# Patient Record
Sex: Male | Born: 1965 | Race: White | Hispanic: No | Marital: Married | Smoking: Former smoker
Health system: Southern US, Community
[De-identification: ages and names within clinical notes are randomized; demographics above are authoritative.]

## PROBLEM LIST (undated history)

## (undated) DIAGNOSIS — K219 Gastro-esophageal reflux disease without esophagitis: Secondary | ICD-10-CM

## (undated) DIAGNOSIS — Z8547 Personal history of malignant neoplasm of testis: Secondary | ICD-10-CM

## (undated) DIAGNOSIS — IMO0002 Reserved for concepts with insufficient information to code with codable children: Secondary | ICD-10-CM

## (undated) DIAGNOSIS — C6290 Malignant neoplasm of unspecified testis, unspecified whether descended or undescended: Secondary | ICD-10-CM

## (undated) DIAGNOSIS — E039 Hypothyroidism, unspecified: Secondary | ICD-10-CM

## (undated) DIAGNOSIS — F419 Anxiety disorder, unspecified: Secondary | ICD-10-CM

## (undated) DIAGNOSIS — R002 Palpitations: Secondary | ICD-10-CM

## (undated) DIAGNOSIS — E038 Other specified hypothyroidism: Secondary | ICD-10-CM

## (undated) HISTORY — DX: Other specified hypothyroidism: E03.8

## (undated) HISTORY — DX: Reserved for concepts with insufficient information to code with codable children: IMO0002

## (undated) HISTORY — DX: Palpitations: R00.2

## (undated) HISTORY — DX: Gastro-esophageal reflux disease without esophagitis: K21.9

## (undated) HISTORY — DX: Personal history of malignant neoplasm of testis: Z85.47

## (undated) HISTORY — DX: Hypothyroidism, unspecified: E03.9

## (undated) HISTORY — DX: Malignant neoplasm of unspecified testis, unspecified whether descended or undescended: C62.90

## (undated) HISTORY — DX: Anxiety disorder, unspecified: F41.9

---

## 1986-11-27 HISTORY — PX: HAND SURGERY: SHX662

## 1991-11-28 DIAGNOSIS — Z8547 Personal history of malignant neoplasm of testis: Secondary | ICD-10-CM

## 1991-11-28 HISTORY — DX: Personal history of malignant neoplasm of testis: Z85.47

## 2008-01-10 ENCOUNTER — Ambulatory Visit (HOSPITAL_COMMUNITY)
Admission: RE | Admit: 2008-01-10 | Discharge: 2008-01-10 | Payer: Self-pay | Admitting: Physical Medicine and Rehabilitation

## 2008-01-23 ENCOUNTER — Ambulatory Visit (HOSPITAL_COMMUNITY): Admission: RE | Admit: 2008-01-23 | Discharge: 2008-01-24 | Payer: Self-pay | Admitting: Specialist

## 2008-03-06 ENCOUNTER — Emergency Department (HOSPITAL_COMMUNITY): Admission: EM | Admit: 2008-03-06 | Discharge: 2008-03-06 | Payer: Self-pay | Admitting: Emergency Medicine

## 2008-11-27 HISTORY — PX: BACK SURGERY: SHX140

## 2009-06-30 ENCOUNTER — Encounter: Admission: RE | Admit: 2009-06-30 | Discharge: 2009-06-30 | Payer: Self-pay | Admitting: Orthopaedic Surgery

## 2010-01-19 IMAGING — CR DG LUMBAR SPINE 2-3V
2 series · 2 of 2 positions shown · non-contrast
Comparison: MRI of the lumbar spine done at [facility] 01/10/08.

CLINICAL DATA: L5-S1 disc herniation.  Pre-diskectomy. 
 LUMBAR SPINE ? 2 VIEW:

[t l-spine a.p.]
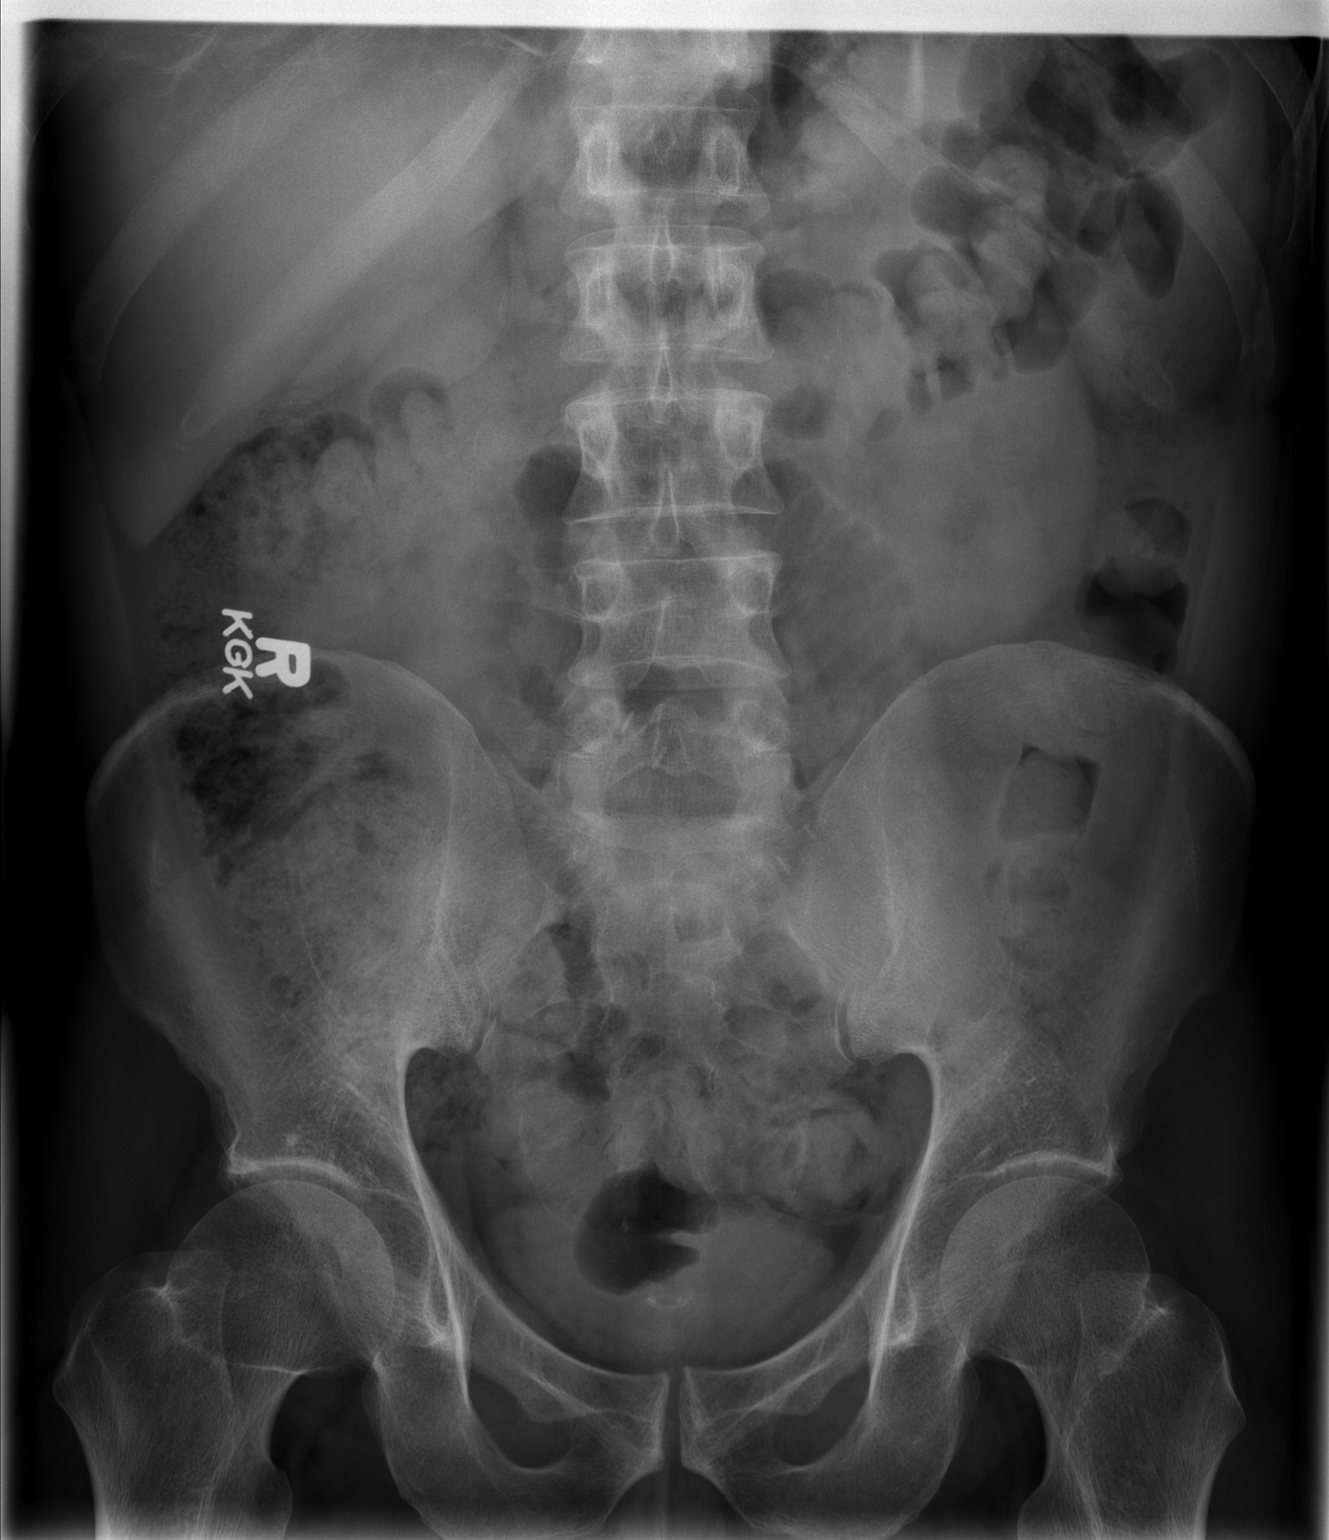

[t l-spine lat]
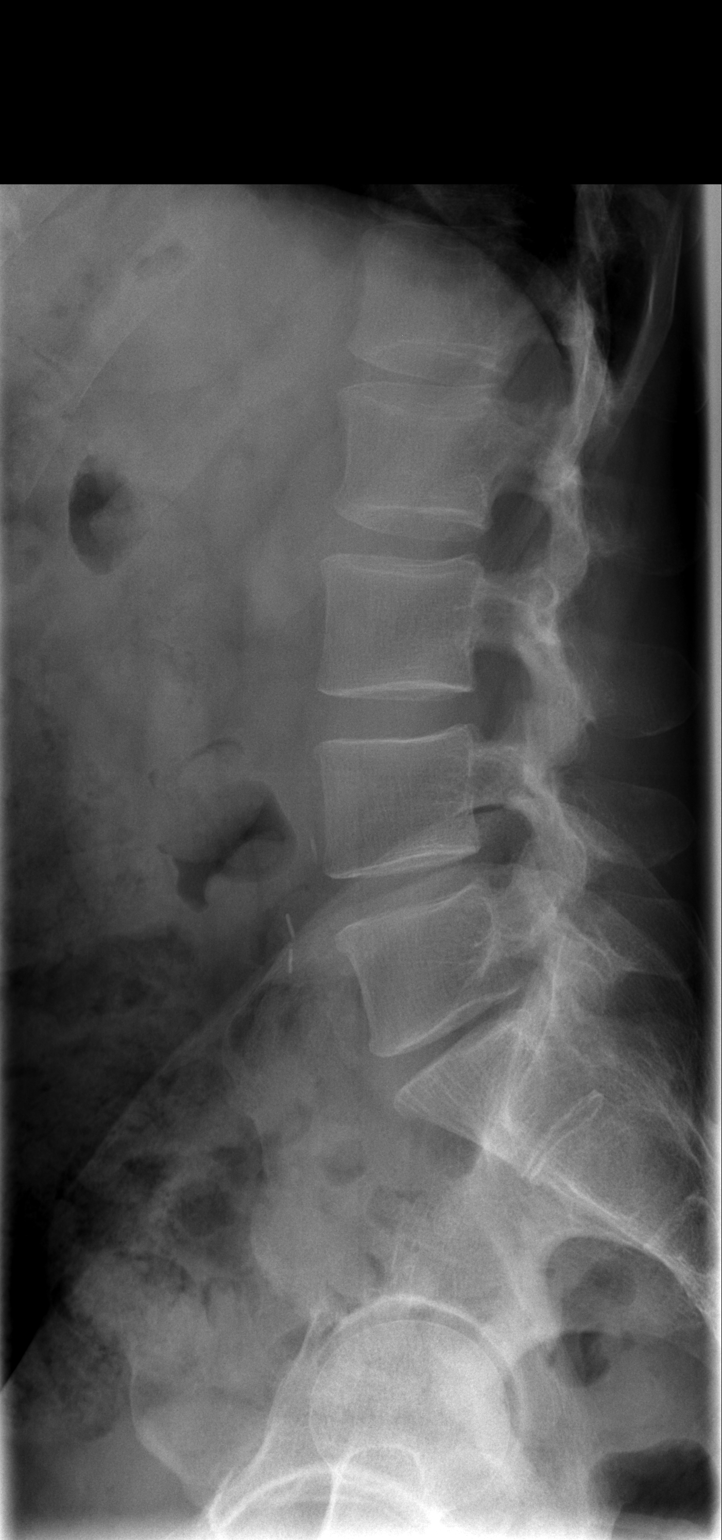

[2 of 2 positions shown; findings below may reference images not displayed]

FINDINGS: The prior MRI assumed the presence of five lumbar type vertebral bodies.  The same numbering scheme was applied to today?s radiographs.  Utilizing this numbering scheme, there may be small ribs at L1.  There is mild disc space loss at L5-S1.  Surgical clips are noted anterior to the L5 vertebral body.  No acute osseous findings are seen.
IMPRESSION: Spinal numbering as correlated with previous MRI from 01/10/08.  Mild disc space loss at L5-S1.

## 2011-04-11 NOTE — Op Note (Signed)
[name], [name] NO.:  [id number]   MEDICAL RECORD NO.:  [id number]          PATIENT TYPE:  OIB   LOCATION:  1605                         FACILITY:  [facility]   PHYSICIAN:  Jene Every, M.D.    DATE OF BIRTH:  August 10, 1966   DATE OF PROCEDURE:  01/23/2008  DATE OF DISCHARGE:  01/24/2008                               OPERATIVE REPORT   PREOPERATIVE DIAGNOSIS:  1. 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  2. Disk degeneration L5-S1 left.   POSTOPERATIVE DIAGNOSIS:  1. 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  2. Disk degeneration L5-S1 left.   OPERATION/PROCEDURE:  1. lateral recess decompression foraminotomy of S1-L5.  2. Microdiskectomy L5-S1, left.   ANESTHESIA:  General.   ASSISTANT:  Roma Schanz, P.A.   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  He was  indicated for decompression L5-S1 nerve roots by microdiskectomy.  The  patient is noted to be a smoker and was advised tobacco cessation.  Thus, risks and benefits including bleeding and infection, hemorrhage,  rash __________ change in symptoms, worsening symptoms, need for repeat  decompression, anesthetic complications, etc.   With the patient in supine position after induction of adequate  anesthesia and 2 grams of Kefzol, he was placed prone on the [place]  frame.  All bony points were well padded.  Lumbar is prepped and draped  in the usual sterile fashion.  Two 18-gauge spinal needles utilized to  localize L5-S1 interspace confirmed with x-ray.  Incision was made  through the spinous process L5-S1.  Subcutaneous tissue was dissected  free to achieve hemostasis.  Dorsolumbar fascia identified by the skin  incision.  Paraspinous muscle elevated from the lamina of L5 and S1.  Operating microscope  draped and brought to the surgical field.  Confirmatory radiographs obtained. Hemilaminotomy at the caudad edge of  L5 was performed with a 2 mm and 3 mm Kerrison. detaching ligamentum  flavum.  Ligamentum flavum was detached from cephalad edge of S1  utilizing straight curette.  Ligamentum flavum removed from the  interspace.  Using the operating microscope, we copiously irrigated the  wound.  We gently mobilized the thecal sac.  The nerve was found to be  flattened and compressed in the lateral recess.  We decompressed the  lateral recess to the medial border of the pedicle and out laterally.  I  gently mobilized.  The disk herniation was noted and found to be large.  With a nerve hook, the fragment was then removed with micro pituitary  translating laterally from beneath the nerve root.  The small fragments  were removed and this fascia, which gave [place] some mild ability to gain  access to beneath thecal sac and lateral to the S1 nerve root at the  region of the disk space. Then the nerve root then mobilized a larger  fragment which was treated with micropituitary and then the largest  fragment was  noted to be connected from beneath thecal sac out of the  foramen of L5 and had migrated cephalad to above the L5 pedicle.  We  removed one large fragment.  This consistent with this mass seen then on  MRI.  Following this there was significant mobility of the S1 nerve root  that was noted.  We first performed foraminotomy of S1.  We then formed  a foraminotomy of L5.  The thecal sac and the S1 nerve root were gently  mobilized medially.  We then performed an annulotomy.  Disk space was  significant generated.  We were unable to place a pituitary the open the  disk space.  I mobilized beneath the thecal sac and incised the  posterior longitudinal ligament as well. There was a small separate  fragment beneath that which was retrieved.  Just medial to that though,  there were adhesions of the  thecal sac to the posterior longitudinal  ligament. I felt further longitudinal dissection here would place him at  risk for a CSF  leakage.  I felt the predominance of a compression was  relieved with the removal of the fragments previously mentioned.  Disk  space copiously irrigated with antibiotic irrigation and bipolar cautery  was allowed to achieve hemostasis.  Inspection revealed no signs of CSF  leakage or active bleeding.  There was good excursion of the S1 nerve  root was medial to the pedicle.  Hockey stick probe passed freely out of  L5-S1.  Epidural fat was then placed over the thecal sac and the root.  Removed the [facility] retractor.  Paraspinous muscle inspected with no  active bleeding.  We copiously irrigated the wound, repaired the fascia  with #1 Vicryl interrupted figure-of-eight sutures, subcutaneous tissue  with 2-0 Vicryl simple sutures and the skin was reapproximated with 4-0  subcuticular Prolene.  Wound reinforced with Steri-Strips.  Sterile  dressing applied.  He was placed supine on hospital bed, extubated  without difficulty and transported to the recovery room in satisfactory  condition.   The patient tolerated the procedure well. There were no complications.  Minimal blood loss.      Jene Every, M.D.  Electronically Signed     JB/MEDQ  D:  01/23/2008  T:  01/24/2008  Job:  [id number]

## 2011-08-18 LAB — CBC
HCT: 40.3
MCHC: 34.7
MCV: 95.2
Platelets: 435 — ABNORMAL HIGH
RDW: 12.8

## 2011-08-18 LAB — COMPREHENSIVE METABOLIC PANEL
Albumin: 4.1
BUN: 10
Calcium: 9.6
Chloride: 102
Creatinine, Ser: 0.81
GFR calc Af Amer: >60
Total Bilirubin: 0.9
Total Protein: 7.2

## 2011-08-22 ENCOUNTER — Ambulatory Visit: Payer: Self-pay

## 2011-08-22 LAB — DIFFERENTIAL
Basophils Relative: 1
Eosinophils Relative: 2
Monocytes Absolute: 0.7
Monocytes Relative: 6
Neutro Abs: 9.3 — ABNORMAL HIGH

## 2011-08-22 LAB — POCT CARDIAC MARKERS
CKMB, poc: <1 — ABNORMAL LOW
Myoglobin, poc: 39.5

## 2011-08-22 LAB — CBC
HCT: 39.4
Hemoglobin: 13.4
MCHC: 34.2
RBC: 4.16 — ABNORMAL LOW

## 2011-08-22 LAB — POCT I-STAT, CHEM 8
Creatinine, Ser: 1
HCT: 42
Hemoglobin: 14.3
Potassium: 4.2
Sodium: 138

## 2014-08-14 ENCOUNTER — Other Ambulatory Visit: Payer: Self-pay | Admitting: Orthopaedic Surgery

## 2014-08-14 DIAGNOSIS — M47812 Spondylosis without myelopathy or radiculopathy, cervical region: Secondary | ICD-10-CM

## 2014-08-25 ENCOUNTER — Ambulatory Visit
Admission: RE | Admit: 2014-08-25 | Discharge: 2014-08-25 | Disposition: A | Discharge status: Home / Self Care | Payer: Medicare Other | Source: Ambulatory Visit | Attending: Orthopaedic Surgery | Admitting: Orthopaedic Surgery

## 2014-08-25 DIAGNOSIS — M47812 Spondylosis without myelopathy or radiculopathy, cervical region: Secondary | ICD-10-CM

## 2015-01-05 DIAGNOSIS — G894 Chronic pain syndrome: Secondary | ICD-10-CM | POA: Diagnosis not present

## 2015-01-05 DIAGNOSIS — Z79891 Long term (current) use of opiate analgesic: Secondary | ICD-10-CM | POA: Diagnosis not present

## 2015-01-05 DIAGNOSIS — Z79899 Other long term (current) drug therapy: Secondary | ICD-10-CM | POA: Diagnosis not present

## 2015-01-05 DIAGNOSIS — M5002 Cervical disc disorder with myelopathy, mid-cervical region: Secondary | ICD-10-CM | POA: Diagnosis not present

## 2015-03-05 DIAGNOSIS — Z6825 Body mass index (BMI) 25.0-25.9, adult: Secondary | ICD-10-CM | POA: Diagnosis not present

## 2015-03-05 DIAGNOSIS — R0789 Other chest pain: Secondary | ICD-10-CM | POA: Diagnosis not present

## 2015-03-05 DIAGNOSIS — R002 Palpitations: Secondary | ICD-10-CM | POA: Diagnosis not present

## 2015-03-08 ENCOUNTER — Encounter: Payer: Self-pay | Admitting: Cardiology

## 2015-03-08 ENCOUNTER — Ambulatory Visit (INDEPENDENT_AMBULATORY_CARE_PROVIDER_SITE_OTHER): Payer: Self-pay | Admitting: Cardiology

## 2015-03-08 VITALS — BP 144/86 | HR 67 | Ht 71.0 in | Wt 179.7 lb

## 2015-03-08 DIAGNOSIS — Z87891 Personal history of nicotine dependence: Secondary | ICD-10-CM

## 2015-03-08 DIAGNOSIS — R262 Difficulty in walking, not elsewhere classified: Secondary | ICD-10-CM | POA: Insufficient documentation

## 2015-03-08 DIAGNOSIS — R072 Precordial pain: Secondary | ICD-10-CM | POA: Insufficient documentation

## 2015-03-08 DIAGNOSIS — R030 Elevated blood-pressure reading, without diagnosis of hypertension: Secondary | ICD-10-CM

## 2015-03-08 DIAGNOSIS — R079 Chest pain, unspecified: Secondary | ICD-10-CM

## 2015-03-08 DIAGNOSIS — R42 Dizziness and giddiness: Secondary | ICD-10-CM

## 2015-03-08 NOTE — Assessment & Plan Note (Signed)
Not sure what this is associated with. He had similar symptoms when he is on narcotics in the past but has not used ultrasound a while. This may all be related to his back pain. It does not sound like he was near syncopal or vertigo.

## 2015-03-08 NOTE — Assessment & Plan Note (Signed)
Features as indicated above. As he is not able to walk on treadmill, we will proceed Lexiscan Myoview stress test for ischemia.

## 2015-03-08 NOTE — Progress Notes (Addendum)
PATIENT: [name] MRN: [id number] DOB: 1966-11-23 PCP: Leonides Sake, MD  Clinic Note: Chief Complaint  Patient presents with  . New Patient    pt c/o off and on chest tightness, discomfort in shoulder and diziness  . Chest Pain    HPI: [name] is a 49 y.o. male with a PMH below who presents today for evaluation of CP & lightheadedness. Sx over past ~ 1 month or so.  No Cardiac RFs except for former smoker.he has previously seen Dr. Recardo Evangelist several years ago and was evaluated with a Myoview stress test that did not show signs of ischemia. He then did not followup. Paper chart was not pulled and therefore study reports are not yet available.   Interval History: Had evaluation in the past for similar Sx yrs ago following back Sgx (~2011 - negative Myoview).  C/o chest heaviness that is intermittent - can last 1-2 hrs or all day. Notes lightheadedness, but no near syncope or syncope.  Can be lightheaded all day w/o other Sx.  Some days has all the Sx.  Chest discomfort is not exacerbated by exertion etc.  House work & yard work - doesn't make Sx worse.  Just feels bad all the time.    He describes the discomfort is just as a heaviness and pressure was across the top of his chest and can radiate down the left arm and shoulder. It is not exacerbated necessarily by exertion. He doesn't note any really makes it better. It can last anywhere from 30 minutes to all day. He also notes intermittent palpitations and lightheadedness during the course of the day but no near syncope or syncope.  Cardiovascular ROS: positive for - chest pain, palpitations and lightheadedness negative for - dyspnea on exertion, edema, loss of consciousness, murmur, orthopnea, paroxysmal nocturnal dyspnea, rapid heart rate, shortness of breath or TIA,/amaurosis fugx, syncope/near syncope :   Past Medical History  Diagnosis Date  . H/O testicular cancer 1993    s/p R orchiectomy w/ LN dissection, Chemo      Prior Cardiac Evaluation and Past Surgical History: Past Surgical History  Procedure Laterality Date  . Back surgery  2010    Lumber fusion  . Hand surgery  1988    No Known Allergies  Current Outpatient Prescriptions  Medication Sig Dispense Refill  . naproxen (NAPROSYN) 500 MG tablet Take 1 tablet by mouth 2 (two) times daily.  2  . traMADol (ULTRAM) 50 MG tablet Take 50 mg by mouth every 6 (six) hours as needed.  2   No current facility-administered medications for this visit.    History  Substance Use Topics  . Smoking status: Former Smoker    Quit date: 03/07/2008  . Smokeless tobacco: Not on file  . Alcohol Use: 0.0 oz/week    0 Standard drinks or equivalent per week     Comment: very seldom   currently disabled secondary to back pain/back surgery  family history includes Cancer (age of onset: 57) in his father; Cancer (age of onset: 25) in his brother; Coronary artery disease in his brother; Diabetes in his brother; Heart attack (age of onset: 46) in his brother.  ROS: A comprehensive Review of Systems - was performed Review of Systems  Constitutional: Positive for malaise/fatigue.  Respiratory: Positive for cough (Morning cough). Negative for shortness of breath and wheezing.   Cardiovascular: Positive for chest pain. Negative for claudication.       Per history of present illness  Gastrointestinal: Negative for blood in stool and melena.  Genitourinary: Negative for hematuria.  Musculoskeletal: Positive for back pain (Back pain with radiculopathy. In its activity level. He is disabled.).  Neurological: Positive for weakness. Negative for dizziness, speech change, focal weakness and loss of consciousness.  All other systems reviewed and are negative.   PHYSICAL EXAM BP 144/86 mmHg  Pulse 67  Ht 5\' 11"  (1.803 m)  Wt 179 lb 11.2 oz (81.511 kg)  BMI 25.07 kg/m2 General appearance: alert, cooperative, no distress and Somewhat anxious and concerned.  Otherwise pleasant mood and affect. Neck: no adenopathy, no carotid bruit, no JVD and supple, symmetrical, trachea midline Lungs: clear to auscultation bilaterally, normal percussion bilaterally and Nonlabored, good air movement Heart: regular rate and rhythm, S1, S2 normal, no murmur, click, rub or gallop and normal apical impulse Abdomen: soft, non-tender; bowel sounds normal; no masses,  no organomegaly Extremities: extremities normal, atraumatic, no cyanosis or edema Pulses: 2+ and symmetric Skin: Skin color, texture, turgor normal. No rashes or lesions Neurologic: Alert and oriented X 3, normal strength and tone. Normal symmetric reflexes. Normal coordination and gait   Adult ECG Report  Rate: 67 ;  Rhythm: normal sinus rhythm  QRS Axis: 50 ;  PR Interval: 114 ;  QRS Duration: 108 ; QTc: 426,;  Voltages: normal.  Narrative Interpretation: essentially normal EKG  Recent Labs: from PCP dated 03/06/2015:  Na+ 141, K+ 4.6, Cl- 100, HCO3- 24 , BUN 15, Cr 0.94, Glu 95, Ca2+ 10; AST 16, ALT 18 AlkP 65, Alb 5.1, TP 7.3, T Bili 0.5; TSH 2.2  CBC: W 7.6, H/H 15.3/45.5, Plt 350  Lipids not provided  ASSESSMENT / PLAN: 49 year old gentleman long-standing chronic back pain who now presents with prolonged left-sided chest discomfort radiating to the left arm and jaw. It is somewhat atypical infected has been long-standing and last usually hours to all day. Also atypical features include infected it is not necessarily exacerbated with exertion. His main cardiac risk factor is that he is a smoker. Unfortunately he is not able to walk on treadmill due to back pain.  Problem List Items Addressed This Visit    Borderline high blood pressure    borderline pressure today. We'll continue to monitor. Pending results of stress test we may need to treat.      Cannot walk    Inability to walk on treadmill secondary to neurologic condition. Will need to use Lexiscan Myoview.      Relevant Orders   EKG  12-Lead   Myocardial Perfusion Imaging   Chest pain with moderate risk for cardiac etiology - Primary    Features as indicated above. As he is not able to walk on treadmill, we will proceed Lexiscan Myoview stress test for ischemia.      Relevant Orders   EKG 12-Lead   Myocardial Perfusion Imaging   Former smoker (Chronic)   Lightheadedness    Not sure what this is associated with. He had similar symptoms when he is on narcotics in the past but has not used ultrasound a while. This may all be related to his back pain. It does not sound like he was near syncopal or vertigo.         Meds ordered this encounter  Medications  . naproxen (NAPROSYN) 500 MG tablet    Sig: Take 1 tablet by mouth 2 (two) times daily.    Refill:  2  . traMADol (ULTRAM) 50 MG tablet    Sig: Take 50  mg by mouth every 6 (six) hours as needed.    Refill:  2    Followup: ~1 months  Kionte Baumgardner W. Ellyn Hack, M.D., M.S. Interventional Cardiolgy [facility]

## 2015-03-08 NOTE — Patient Instructions (Signed)
Your physician has requested that you have a lexiscan myoview. For further information please visit [email or web address]. Please follow instruction sheet, as given.  Your physician recommends that you schedule a follow-up appointment in AFTER TEST AT NEXT AVAILABLE OFFICE VISIT

## 2015-03-08 NOTE — Assessment & Plan Note (Signed)
Inability to walk on treadmill secondary to neurologic condition. Will need to use Lexiscan Myoview.

## 2015-03-08 NOTE — Assessment & Plan Note (Signed)
borderline pressure today. We'll continue to monitor. Pending results of stress test we may need to treat.

## 2015-03-18 ENCOUNTER — Telehealth (HOSPITAL_COMMUNITY): Payer: Self-pay

## 2015-03-18 NOTE — Telephone Encounter (Signed)
Encounter complete. 

## 2015-03-23 ENCOUNTER — Ambulatory Visit (HOSPITAL_COMMUNITY)
Admission: RE | Admit: 2015-03-23 | Discharge: 2015-03-23 | Disposition: A | Discharge status: Home / Self Care | Payer: Medicare Other | Source: Ambulatory Visit | Attending: Cardiology | Admitting: Cardiology

## 2015-03-23 DIAGNOSIS — Z87891 Personal history of nicotine dependence: Secondary | ICD-10-CM | POA: Diagnosis not present

## 2015-03-23 DIAGNOSIS — Z8249 Family history of ischemic heart disease and other diseases of the circulatory system: Secondary | ICD-10-CM | POA: Diagnosis not present

## 2015-03-23 DIAGNOSIS — R42 Dizziness and giddiness: Secondary | ICD-10-CM | POA: Insufficient documentation

## 2015-03-23 DIAGNOSIS — I10 Essential (primary) hypertension: Secondary | ICD-10-CM | POA: Diagnosis not present

## 2015-03-23 DIAGNOSIS — R079 Chest pain, unspecified: Secondary | ICD-10-CM | POA: Insufficient documentation

## 2015-03-23 DIAGNOSIS — R0609 Other forms of dyspnea: Secondary | ICD-10-CM | POA: Insufficient documentation

## 2015-03-23 DIAGNOSIS — R0602 Shortness of breath: Secondary | ICD-10-CM | POA: Diagnosis not present

## 2015-03-23 DIAGNOSIS — R002 Palpitations: Secondary | ICD-10-CM | POA: Insufficient documentation

## 2015-03-23 DIAGNOSIS — R262 Difficulty in walking, not elsewhere classified: Secondary | ICD-10-CM

## 2015-03-23 HISTORY — PX: NM MYOVIEW LTD: HXRAD82

## 2015-03-23 MED ORDER — TECHNETIUM TC 99M SESTAMIBI GENERIC - CARDIOLITE
10.6000 | Freq: Once | INTRAVENOUS | Status: AC | PRN
  Administered 2015-03-23: 11 via INTRAVENOUS

## 2015-03-23 MED ORDER — REGADENOSON 0.4 MG/5ML IV SOLN
0.4000 mg | Freq: Once | INTRAVENOUS | Status: AC
  Administered 2015-03-23: 0.4 mg via INTRAVENOUS

## 2015-03-23 MED ORDER — TECHNETIUM TC 99M SESTAMIBI GENERIC - CARDIOLITE
30.2000 | Freq: Once | INTRAVENOUS | Status: AC | PRN
  Administered 2015-03-23: 30.2 via INTRAVENOUS

## 2015-03-23 MED ORDER — AMINOPHYLLINE 25 MG/ML IV SOLN
75.0000 mg | Freq: Once | INTRAVENOUS | Status: AC
  Administered 2015-03-23: 75 mg via INTRAVENOUS

## 2015-03-23 NOTE — Progress Notes (Signed)
Quick Note:  Nuclear Stress Test Result  ECG is normal; &Images look good. No sign of large Vessel ischemia. Normal function.  Leonie Man, MD  Pls fwd to PCP - Dr. Daiva Eves ______

## 2015-03-23 NOTE — Procedures (Addendum)
[facility] [address] [place] [address] [phone]  Cardiology Nuclear Med Study  [name] is a 49 y.o. male     MRN : [id number]     DOB: 1966/10/06  Procedure Date: 03/23/2015  Nuclear Med Background Indication for Stress Test:  Evaluation for Ischemia History:  No prior cardiac or respiratory history reported. Last NUC MPI in 2011-not in EPIC for review. Cardiac Risk Factors: Family History - CAD, History of Smoking and Hypertension  Symptoms:  Chest Pain, Dizziness, DOE, Fatigue, Light-Headedness, Palpitations and SOB   Nuclear Pre-Procedure Caffeine/Decaff Intake:  9:30pm NPO After: 5:30am   IV Site: R Forearm  IV 0.9% NS with Angio Cath:  22g  Chest Size (in):  40" IV Started by: Larene Beach, RN  Height: 5\' 11"  (1.803 m)  Cup Size: n/a  BMI:  Body mass index is 24.98 kg/(m^2). Weight:  179 lb (81.194 kg)   Tech Comments:  n/a    Nuclear Med Study 1 or 2 day study: 1 day  Stress Test Type:  [place] Provider:  Mathews Robinsons   Resting Radionuclide: Technetium 79m Sestamibi  Resting Radionuclide Dose: 10.6 mCi   Stress Radionuclide:  Technetium 64m Sestamibi  Stress Radionuclide Dose: 30.2 mCi           Stress Protocol Rest HR: 60 Stress HR: 118  Rest BP: 118/80 Stress BP: 130/68  Exercise Time (min): n/a METS: n/a          Dose of Adenosine (mg):  n/a Dose of Lexiscan: 0.4 mg  Dose of Atropine (mg): n/a Dose of Dobutamine: n/a mcg/kg/min (at max HR)  Stress Test Technologist: Mellody Memos, CCT Nuclear Technologist:Elizabeth Young,CNMT   Rest Procedure:  Myocardial perfusion imaging was performed at rest 45 minutes following the intravenous administration of Technetium 74m Sestamibi. Stress Procedure:  The patient received IV Lexiscan 0.4 mg over 15-seconds.  Technetium 3m Sestamibi injected IV at 30-seconds.  Patient experienced shortness of breath, stomach  pains, head pains and was administered 75 mg of Aminophylline IV. There were no significant changes with Lexiscan.  Quantitative spect images were obtained after a 45 minute delay.   Rest ECG: NSR - Normal EKG  Stress ECG: No significant change from baseline ECG  QPS Raw Data Images:  Normal; no motion artifact; normal heart/lung ratio. Stress Images:  Normal homogeneous uptake in all areas of the myocardium. Rest Images:  Normal homogeneous uptake in all areas of the myocardium. Subtraction (SDS):  No evidence of ischemia. LV Wall Motion:  NL LV Function; NL Wall Motion  Impression Exercise Capacity:  Lexiscan with no exercise. BP Response:  Normal blood pressure response. Clinical Symptoms:  No significant symptoms noted. ECG Impression:  No significant ST segment change suggestive of ischemia. Comparison with Prior Nuclear Study: No previous nuclear study performed   Overall Impression:  Normal stress nuclear study.   [name], MD  03/23/2015 1:15 PM      Transient Ischemic Dilatation (Normal <1.22):  1.21  QGS EDV:  98 ml QGS ESV:  46 ml LV Ejection Fraction: 54%

## 2015-03-24 ENCOUNTER — Telehealth: Payer: Self-pay | Admitting: *Deleted

## 2015-03-24 NOTE — Telephone Encounter (Signed)
-----   Message from Leonie Man, MD sent at 03/23/2015  4:37 PM EDT ----- Nuclear Stress Test Result  ECG is normal; &Images look good.  No sign of large Vessel ischemia. Normal function.  Leonie Man, MD  Pls fwd to PCP - Dr. Daiva Eves

## 2015-03-24 NOTE — Telephone Encounter (Signed)
Spoke to patient. Result given . Verbalized understanding  

## 2015-03-29 DIAGNOSIS — M5002 Cervical disc disorder with myelopathy, mid-cervical region: Secondary | ICD-10-CM | POA: Diagnosis not present

## 2015-03-29 DIAGNOSIS — G894 Chronic pain syndrome: Secondary | ICD-10-CM | POA: Diagnosis not present

## 2015-03-30 DIAGNOSIS — F419 Anxiety disorder, unspecified: Secondary | ICD-10-CM | POA: Diagnosis not present

## 2015-03-30 DIAGNOSIS — L989 Disorder of the skin and subcutaneous tissue, unspecified: Secondary | ICD-10-CM | POA: Diagnosis not present

## 2015-03-30 DIAGNOSIS — Z6824 Body mass index (BMI) 24.0-24.9, adult: Secondary | ICD-10-CM | POA: Diagnosis not present

## 2015-03-30 DIAGNOSIS — G4700 Insomnia, unspecified: Secondary | ICD-10-CM | POA: Diagnosis not present

## 2015-03-31 ENCOUNTER — Other Ambulatory Visit: Payer: Self-pay | Admitting: Orthopaedic Surgery

## 2015-03-31 DIAGNOSIS — M5030 Other cervical disc degeneration, unspecified cervical region: Secondary | ICD-10-CM

## 2015-04-10 ENCOUNTER — Ambulatory Visit
Admission: RE | Admit: 2015-04-10 | Discharge: 2015-04-10 | Disposition: A | Discharge status: Home / Self Care | Payer: Medicare Other | Source: Ambulatory Visit | Attending: Orthopaedic Surgery | Admitting: Orthopaedic Surgery

## 2015-04-10 DIAGNOSIS — M5022 Other cervical disc displacement, mid-cervical region: Secondary | ICD-10-CM | POA: Diagnosis not present

## 2015-04-10 DIAGNOSIS — M5030 Other cervical disc degeneration, unspecified cervical region: Secondary | ICD-10-CM

## 2015-04-12 DIAGNOSIS — G894 Chronic pain syndrome: Secondary | ICD-10-CM | POA: Diagnosis not present

## 2015-04-12 DIAGNOSIS — M5002 Cervical disc disorder with myelopathy, mid-cervical region: Secondary | ICD-10-CM | POA: Diagnosis not present

## 2015-04-21 DIAGNOSIS — M47812 Spondylosis without myelopathy or radiculopathy, cervical region: Secondary | ICD-10-CM | POA: Diagnosis not present

## 2015-04-21 DIAGNOSIS — M542 Cervicalgia: Secondary | ICD-10-CM | POA: Diagnosis not present

## 2015-04-21 DIAGNOSIS — M5002 Cervical disc disorder with myelopathy, mid-cervical region: Secondary | ICD-10-CM | POA: Diagnosis not present

## 2015-05-03 DIAGNOSIS — R42 Dizziness and giddiness: Secondary | ICD-10-CM | POA: Diagnosis not present

## 2015-05-03 DIAGNOSIS — F99 Mental disorder, not otherwise specified: Secondary | ICD-10-CM | POA: Diagnosis not present

## 2015-05-03 DIAGNOSIS — R51 Headache: Secondary | ICD-10-CM | POA: Diagnosis not present

## 2015-05-03 DIAGNOSIS — M79602 Pain in left arm: Secondary | ICD-10-CM | POA: Diagnosis not present

## 2015-05-03 DIAGNOSIS — Z1389 Encounter for screening for other disorder: Secondary | ICD-10-CM | POA: Diagnosis not present

## 2015-05-10 ENCOUNTER — Other Ambulatory Visit: Payer: Self-pay | Admitting: Family Medicine

## 2015-05-10 DIAGNOSIS — G4489 Other headache syndrome: Secondary | ICD-10-CM

## 2015-05-10 DIAGNOSIS — R42 Dizziness and giddiness: Secondary | ICD-10-CM

## 2015-05-11 ENCOUNTER — Ambulatory Visit
Admission: RE | Admit: 2015-05-11 | Discharge: 2015-05-11 | Disposition: A | Discharge status: Home / Self Care | Payer: Medicare Other | Source: Ambulatory Visit | Attending: Family Medicine | Admitting: Family Medicine

## 2015-05-11 DIAGNOSIS — R42 Dizziness and giddiness: Secondary | ICD-10-CM | POA: Diagnosis not present

## 2015-05-11 DIAGNOSIS — G4489 Other headache syndrome: Secondary | ICD-10-CM

## 2015-05-11 DIAGNOSIS — H538 Other visual disturbances: Secondary | ICD-10-CM | POA: Diagnosis not present

## 2015-05-11 DIAGNOSIS — R51 Headache: Secondary | ICD-10-CM | POA: Diagnosis not present

## 2015-05-13 ENCOUNTER — Ambulatory Visit (INDEPENDENT_AMBULATORY_CARE_PROVIDER_SITE_OTHER): Payer: Medicare Other | Admitting: Cardiology

## 2015-05-13 ENCOUNTER — Encounter: Payer: Self-pay | Admitting: Cardiology

## 2015-05-13 VITALS — BP 122/84 | HR 74 | Ht 71.0 in | Wt 173.7 lb

## 2015-05-13 DIAGNOSIS — R002 Palpitations: Secondary | ICD-10-CM

## 2015-05-13 DIAGNOSIS — R030 Elevated blood-pressure reading, without diagnosis of hypertension: Secondary | ICD-10-CM

## 2015-05-13 DIAGNOSIS — R079 Chest pain, unspecified: Secondary | ICD-10-CM | POA: Diagnosis not present

## 2015-05-13 NOTE — Progress Notes (Signed)
PATIENT: CADON RACZKA MRN: [id number] DOB: 06/29/1966 PCP: Leonides Sake, MD  Clinic Note: Chief Complaint  Patient presents with  . Follow-up     chest pain-palpitations, pain and numbness in left arm and chest tightness, no shortness of breath, no edema, no pain in legs, no cramping in legs, no lightheadedness, no dizziness  . Chest Pain    HPI: Harlan Ervine Rodas is a 49 y.o. male with a PMH below who presents today for evaluation of CP & lightheadedness. Sx over past ~ 1 month or so.  No Cardiac RFs except for former smoker.he has previously seen Dr. Recardo Evangelist several years ago and was evaluated with a Myoview stress test that did not show signs of ischemia. He then did not followup. Paper chart was not pulled and therefore study reports are not yet available. Seen in April 2016:  He describesd the discomfort is just as a heaviness and pressure was across the top of his chest and can radiate down the left arm and shoulder. It is not exacerbated necessarily by exertion. He doesn't note any really makes it better. It can last anywhere from 30 minutes to all day. He also notes intermittent palpitations and lightheadedness during the course of the day but no near syncope or syncope. Negative Nuclear Stress Test 02/2015 Neck pain still bad -- ? Herniated disc on MRI    Interval History:   PCP started Lexapro for anxiety - palpitations.  Palpitations are better - but notes it when HA & neck pain occurs. Dizziness with HA - but no syncope/near syncope, but arms get numb & heavy, poor balance.  Now on Neurontin & plan for PT.   Most of his symptoms he notes is related to his left upper chest and left arm. Occasionally goes into the neck. House work & yard work - doesn't make Sx worse.  Just feels bad all the time.    Cardiovascular ROS: positive for - chest pain, palpitations and lightheadedness negative for - dyspnea on exertion, edema, loss of consciousness, murmur, orthopnea, paroxysmal  nocturnal dyspnea, rapid heart rate, shortness of breath or TIA,/amaurosis fugax, syncope/near syncope :   Past Medical History  Diagnosis Date  . H/O testicular cancer 1993    s/p R orchiectomy w/ LN dissection, Chemo  . Anxiety disorder     Prior Cardiac Evaluation and Past Surgical History: Past Surgical History  Procedure Laterality Date  . Back surgery  2010    Lumber fusion  . Hand surgery  1988  . Nm myoview ltd  03/23/2015    NORMAL Lexiscan Myoview: No ST segment changes. No evidence of ischemia or infarction. Normal function.    No Known Allergies  Current Outpatient Prescriptions  Medication Sig Dispense Refill  . escitalopram (LEXAPRO) 10 MG tablet Take 1 tablet by mouth daily. Take 1 tab daily    . gabapentin (NEURONTIN) 300 MG capsule Take 1 capsule by mouth daily. Take 1-2 tabs per day as needed for pain  1  . zolpidem (AMBIEN) 10 MG tablet Take 1 tablet by mouth at bedtime. Take 0.5 to 1 tab at bedtime as needed  2   No current facility-administered medications for this visit.    History  Substance Use Topics  . Smoking status: Former Smoker    Quit date: 03/07/2008  . Smokeless tobacco: Not on file  . Alcohol Use: 0.0 oz/week    0 Standard drinks or equivalent per week     Comment: very seldom  currently disabled secondary to back pain/back surgery  family history includes Cancer (age of onset: 48) in his father; Cancer (age of onset: 71) in his brother; Coronary artery disease in his brother; Diabetes in his brother; Heart attack (age of onset: 61) in his brother.  ROS: A comprehensive Review of Systems - was performed Review of Systems  Constitutional: Positive for malaise/fatigue.  Respiratory: Positive for cough (Morning cough). Negative for shortness of breath and wheezing.   Cardiovascular: Positive for chest pain. Negative for claudication.       Per history of present illness  Gastrointestinal: Negative for blood in stool and melena.    Genitourinary: Negative for hematuria.  Musculoskeletal: Positive for back pain (Back pain with radiculopathy. In its activity level. He is disabled.).  Neurological: Positive for weakness. Negative for dizziness, speech change, focal weakness and loss of consciousness.  All other systems reviewed and are negative.   PHYSICAL EXAM BP 122/84 mmHg  Pulse 74  Ht 5\' 11"  (1.803 m)  Wt 78.79 kg (173 lb 11.2 oz)  BMI 24.24 kg/m2 General appearance: alert, cooperative, no distress and Somewhat anxious and concerned. Otherwise pleasant mood and affect. Neck: no adenopathy, no carotid bruit, no JVD and supple, symmetrical, trachea midline Lungs: clear to auscultation bilaterally, normal percussion bilaterally and Nonlabored, good air movement Heart: regular rate and rhythm, S1, S2 normal, no murmur, click, rub or gallop and normal apical impulse Abdomen: soft, non-tender; bowel sounds normal; no masses,  no organomegaly Extremities: extremities normal, atraumatic, no cyanosis or edema Pulses: 2+ and symmetric Skin: Skin color, texture, turgor normal. No rashes or lesions Neurologic: Alert and oriented X 3, normal strength and tone. Normal symmetric reflexes. Normal coordination and gait   Adult ECG Report - N/A  Recent Labs: from PCP dated 03/06/2015:  Na+ 141, K+ 4.6, Cl- 100, HCO3- 24 , BUN 15, Cr 0.94, Glu 95, Ca2+ 10; AST 16, ALT 18 AlkP 65, Alb 5.1, TP 7.3, T Bili 0.5; TSH 2.2  CBC: W 7.6, H/H 15.3/45.5, Plt 350  Lipids not provided  ASSESSMENT / PLAN: 49 year old gentleman long-standing chronic back pain who now presents with prolonged left-sided chest discomfort radiating to the left arm and jaw. Mostly atypical atypical infected has been long-standing and last usually hours to all day. Now here for follow-up after negative Myoview stress test.  Problem List Items Addressed This Visit    Borderline high blood pressure    Better pressure today. Mostly related to stress probably.       Chest pain with low risk for cardiac etiology - Primary    As noted, most the atypical symptoms. Negative Myoview which is his second negative Myoview in couple years. Very unlikely cardiac in nature as it is not exacerbated by exertion.  Most likely related to his musculoskeletal issues as well as possible anxiety.      Intermittent palpitations (Chronic)    He does have intermittent palpitations are not prolonged. Most likely related to PVCs or PACs. Has not had a prolonged episode which remains is most likely not a rhythm. I will prefer not to try to treat him with a beta blocker, however if these do become more consistently bothersome, I would consider dihydropyridine calcium channel blocker such as diltiazem or verapamil. These have calmed down notably since starting Lexapro.         Meds ordered this encounter  Medications  . escitalopram (LEXAPRO) 10 MG tablet    Sig: Take 1 tablet by mouth daily. Take  1 tab daily  . zolpidem (AMBIEN) 10 MG tablet    Sig: Take 1 tablet by mouth at bedtime. Take 0.5 to 1 tab at bedtime as needed    Refill:  2  . gabapentin (NEURONTIN) 300 MG capsule    Sig: Take 1 capsule by mouth daily. Take 1-2 tabs per day as needed for pain    Refill:  1    Followup: PRN  Cayde Held W. Ellyn Hack, M.D., M.S. [facility]

## 2015-05-13 NOTE — Patient Instructions (Addendum)
Your physician recommends that you schedule a follow-up appointment on an as needed basis.

## 2015-05-15 ENCOUNTER — Encounter: Payer: Self-pay | Admitting: Cardiology

## 2015-05-15 DIAGNOSIS — R002 Palpitations: Secondary | ICD-10-CM | POA: Insufficient documentation

## 2015-05-15 NOTE — Assessment & Plan Note (Signed)
As noted, most the atypical symptoms. Negative Myoview which is his second negative Myoview in couple years. Very unlikely cardiac in nature as it is not exacerbated by exertion.  Most likely related to his musculoskeletal issues as well as possible anxiety.

## 2015-05-15 NOTE — Assessment & Plan Note (Signed)
He does have intermittent palpitations are not prolonged. Most likely related to PVCs or PACs. Has not had a prolonged episode which remains is most likely not a rhythm. I will prefer not to try to treat him with a beta blocker, however if these do become more consistently bothersome, I would consider dihydropyridine calcium channel blocker such as diltiazem or verapamil. These have calmed down notably since starting Lexapro.

## 2015-05-15 NOTE — Assessment & Plan Note (Signed)
Better pressure today. Mostly related to stress probably.

## 2015-05-20 ENCOUNTER — Ambulatory Visit (INDEPENDENT_AMBULATORY_CARE_PROVIDER_SITE_OTHER): Payer: Medicare Other | Admitting: Neurology

## 2015-05-20 ENCOUNTER — Other Ambulatory Visit: Payer: Self-pay | Admitting: Neurology

## 2015-05-20 ENCOUNTER — Encounter: Payer: Self-pay | Admitting: Neurology

## 2015-05-20 VITALS — BP 130/78 | HR 82 | Resp 20 | Ht 71.26 in | Wt 175.0 lb

## 2015-05-20 DIAGNOSIS — G3184 Mild cognitive impairment, so stated: Secondary | ICD-10-CM | POA: Insufficient documentation

## 2015-05-20 DIAGNOSIS — R51 Headache: Secondary | ICD-10-CM

## 2015-05-20 DIAGNOSIS — M5010 Cervical disc disorder with radiculopathy, unspecified cervical region: Secondary | ICD-10-CM

## 2015-05-20 DIAGNOSIS — R519 Headache, unspecified: Secondary | ICD-10-CM | POA: Insufficient documentation

## 2015-05-20 MED ORDER — AMITRIPTYLINE HCL 25 MG PO TABS: 25.0000 mg | ORAL_TABLET | Freq: Every day | ORAL | Status: DC

## 2015-05-20 NOTE — Patient Instructions (Signed)
Amitriptyline tablets What is this medicine? AMITRIPTYLINE (a mee TRIP ti leen) is used to treat depression. This medicine may be used for other purposes; ask your health care provider or pharmacist if you have questions. COMMON BRAND NAME(S): Elavil, Vanatrip What should I tell my health care provider before I take this medicine? They need to know if you have any of these conditions: -an alcohol problem -asthma, difficulty breathing -bipolar disorder or schizophrenia -difficulty passing urine, prostate trouble -glaucoma -heart disease or previous heart attack -liver disease -over active thyroid -seizures -thoughts or plans of suicide, a previous suicide attempt, or family history of suicide attempt -an unusual or allergic reaction to amitriptyline, other medicines, foods, dyes, or preservatives -pregnant or trying to get pregnant -breast-feeding How should I use this medicine? Take this medicine by mouth with a drink of water. Follow the directions on the prescription label. You can take the tablets with or without food. Take your medicine at regular intervals. Do not take it more often than directed. Do not stop taking this medicine suddenly except upon the advice of your doctor. Stopping this medicine too quickly may cause serious side effects or your condition may worsen. A special MedGuide will be given to you by the pharmacist with each prescription and refill. Be sure to read this information carefully each time. Talk to your pediatrician regarding the use of this medicine in children. Special care may be needed. Overdosage: If you think you have taken too much of this medicine contact a poison control center or emergency room at once. NOTE: This medicine is only for you. Do not share this medicine with others. What if I miss a dose? If you miss a dose, take it as soon as you can. If it is almost time for your next dose, take only that dose. Do not take double or extra doses. What  may interact with this medicine? Do not take this medicine with any of the following medications: -arsenic trioxide -certain medicines used to regulate abnormal heartbeat or to treat other heart conditions -cisapride -droperidol -halofantrine -linezolid -MAOIs like Carbex, Eldepryl, Marplan, Nardil, and Parnate -methylene blue -other medicines for mental depression -phenothiazines like perphenazine, thioridazine and chlorpromazine -pimozide -probucol -procarbazine -sparfloxacin -St. John's Wort -ziprasidone This medicine may also interact with the following medications: -atropine and related drugs like hyoscyamine, scopolamine, tolterodine and others -barbiturate medicines for inducing sleep or treating seizures, like phenobarbital -cimetidine -disulfiram -ethchlorvynol -thyroid hormones such as levothyroxine This list may not describe all possible interactions. Give your health care provider a list of all the medicines, herbs, non-prescription drugs, or dietary supplements you use. Also tell them if you smoke, drink alcohol, or use illegal drugs. Some items may interact with your medicine. What should I watch for while using this medicine? Tell your doctor if your symptoms do not get better or if they get worse. Visit your doctor or health care professional for regular checks on your progress. Because it may take several weeks to see the full effects of this medicine, it is important to continue your treatment as prescribed by your doctor. Patients and their families should watch out for new or worsening thoughts of suicide or depression. Also watch out for sudden changes in feelings such as feeling anxious, agitated, panicky, irritable, hostile, aggressive, impulsive, severely restless, overly excited and hyperactive, or not being able to sleep. If this happens, especially at the beginning of treatment or after a change in dose, call your health care professional. You may get   drowsy or  dizzy. Do not drive, use machinery, or do anything that needs mental alertness until you know how this medicine affects you. Do not stand or sit up quickly, especially if you are an older patient. This reduces the risk of dizzy or fainting spells. Alcohol may interfere with the effect of this medicine. Avoid alcoholic drinks. Do not treat yourself for coughs, colds, or allergies without asking your doctor or health care professional for advice. Some ingredients can increase possible side effects. Your mouth may get dry. Chewing sugarless gum or sucking hard candy, and drinking plenty of water will help. Contact your doctor if the problem does not go away or is severe. This medicine may cause dry eyes and blurred vision. If you wear contact lenses you may feel some discomfort. Lubricating drops may help. See your eye doctor if the problem does not go away or is severe. This medicine can cause constipation. Try to have a bowel movement at least every 2 to 3 days. If you do not have a bowel movement for 3 days, call your doctor or health care professional. This medicine can make you more sensitive to the sun. Keep out of the sun. If you cannot avoid being in the sun, wear protective clothing and use sunscreen. Do not use sun lamps or tanning beds/booths. What side effects may I notice from receiving this medicine? Side effects that you should report to your doctor or health care professional as soon as possible: -allergic reactions like skin rash, itching or hives, swelling of the face, lips, or tongue -abnormal production of milk in females -breast enlargement in both males and females -breathing problems -confusion, hallucinations -fast, irregular heartbeat -fever with increased sweating -muscle stiffness, or spasms -pain or difficulty passing urine, loss of bladder control -seizures -suicidal thoughts or other mood changes -swelling of the testicles -tingling, pain, or numbness in the feet or  hands -yellowing of the eyes or skin Side effects that usually do not require medical attention (report to your doctor or health care professional if they continue or are bothersome): -change in sex drive or performance -constipation or diarrhea -nausea, vomiting -weight gain or loss This list may not describe all possible side effects. Call your doctor for medical advice about side effects. You may report side effects to FDA at 1-800-FDA-1088. Where should I keep my medicine? Keep out of the reach of children. Store at room temperature between 20 and 25 degrees C (68 and 77 degrees F). Throw away any unused medicine after the expiration date. NOTE: This sheet is a summary. It may not cover all possible information. If you have questions about this medicine, talk to your doctor, pharmacist, or health care provider.  2015, [employer]. (2012-04-01 13:50:32)  

## 2015-05-20 NOTE — Addendum Note (Signed)
Addended by: Larey Seat on: 05/20/2015 09:41 AM   Modules accepted: Orders

## 2015-05-20 NOTE — Progress Notes (Signed)
Provider:  Larey Seat, M D  Referring Provider: Leonides Sake, MD Primary Care Physician:  Leonides Sake, MD  Chief Complaint  Patient presents with  . Headache    rm 10, new patient, alone  . Dizziness    HPI:  [name] is a 49 y.o. male, seen here as a referral from Dr. Lisbeth Ply for "dizziness" ,  05/20/2015 To be evaluated for headaches associated with dizziness.. The patient is an established pain clinic patient and also followed by  surgeon, Dr. Rennis Harding. He has been disabled since his lumbar fusion 2010, at the age of 89.  He underwent a lumbar disc fusion L4-5/ 6 in 2010. Over the last 10 months about September 2015  he has noted increasing neck problems and stiffness. He was diagnosed with a bulging disc in the cervical spine C5-C6.  This protrudes medial and to the right side of the spinal cord. He underwent an MRI on 04-11-15 which is a repeat study  Documenting a disc protrusion  between the fifth and sixth cervical vertebrae a central and right-sided protrusion with uncinate spurring compressing  the right C6 nerve root -impingement is still seen. Dr.: Referred him to pain specialist who in turn started gabapentin and physical therapy. The hope was to avoid surgery. Because of the complaints of headache and dizziness an MRI of the head would have been in order but a CT without contrast was ordered instead which shows no intracranial abnormalities and a normal brain on 05-11-15 it is Dr. Lisbeth Ply now requested this evaluation for headache and dizziness. The patient will remain treated for neck pain back pain and any kind of degenerative spinal joint disorder with his orthopedist and pain specialist Dr. Milas Gain low.  The patient added that he has been suffering from anxiety and insomnia and he has been treated with Lexapro and zolpidem ( insomnia, non organic ) . He denied suicidal ideation. He developed cardiac palpitation and a cardiology workup was normal including  a stress test.  The patient also reports confusion or disorientation problems memory perhaps inattentiveness at times this has preceded his medication regimen and that actually preceded his neck pain complaints. He score today on a Montral cognitive assessment test on 05-20-15, scoring 24 out of 30 points , which is less than expected for his peer group.  His memory concerns are chief concerns,he has also chronic headaches. His headaches begin in the retro-orbital region bilaterally a feeling of pressure behind the eyes, especially the medial part close to the nasal bridge, radiating from there to the occipital region and into the paraspinal area left shoulder and left arm.  Vision gets blurry with these headaches, he reports photosensitivity with his headaches, he has been eating less but he denies any nausea associated with the headaches. He seems to have no visual aura or fortification lines or flickering lights. He does not report any sensitivity to sounds. He states that all his headaches seem to radiates towards the spine and to the left side of the upper body this is curious in light of his MRI report from stating that all his C6 compression would affect the right.   He is still in his physical therapy cycle, and then this is completed he will be followed up by Dr. Maia Petties.  Dr. Lisbeth Ply requested Dr. Ellene Route for a second opinion for the left upper extremity pain and to review the cervical MRI and consider nerve conduction studies.    Review of  Systems: Out of a complete 14 system review, the patient complains of only the following symptoms, and all other reviewed systems are negative. Depression, anxiety, forgetfullnes, chronic back pain- orthopedic and neurosurgical follow up- workers comp. Marland Kitchen   History   Social History  . Marital Status: Married    Spouse Name: [name]  . Number of Children: 1  . Years of Education: N/A   Occupational History  . Not on file.   Social History Main  Topics  . Smoking status: Former Smoker    Quit date: 03/07/2008  . Smokeless tobacco: Not on file  . Alcohol Use: 0.0 oz/week    0 Standard drinks or equivalent per week     Comment: very seldom  . Drug Use: No  . Sexual Activity: Not on file   Other Topics Concern  . Not on file   Social History Narrative   1 GC - 3 wk old.   On Disabliity since Back operation.    Family History  Problem Relation Age of Onset  . Cancer Father 44  . Cancer Brother 71  . Diabetes Brother   . Coronary artery disease Brother     PCI  . Heart attack Brother 89  . Seizures Brother     Past Medical History  Diagnosis Date  . H/O testicular cancer 1993    s/p R orchiectomy w/ LN dissection, Chemo  . Anxiety disorder   . HNP (herniated nucleus pulposus)   . GERD (gastroesophageal reflux disease)   . Palpitations   . Subclinical hypothyroidism   . Testicular malignancy     Past Surgical History  Procedure Laterality Date  . Back surgery  2010    Lumber fusion  . Hand surgery  1988  . Nm myoview ltd  03/23/2015    NORMAL Lexiscan Myoview: No ST segment changes. No evidence of ischemia or infarction. Normal function.    Current Outpatient Prescriptions  Medication Sig Dispense Refill  . escitalopram (LEXAPRO) 10 MG tablet Take 1 tablet by mouth daily. Take 1 tab daily    . gabapentin (NEURONTIN) 300 MG capsule Take 1 capsule by mouth daily. Take 1-2 tabs per day as needed for pain  1  . traMADol (ULTRAM) 50 MG tablet Take 50 mg by mouth every 6 (six) hours as needed.    . zolpidem (AMBIEN) 10 MG tablet Take 1 tablet by mouth at bedtime. Take 0.5 to 1 tab at bedtime as needed  2   No current facility-administered medications for this visit.    Allergies as of 05/20/2015  . (No Known Allergies)    Vitals: BP 130/78 mmHg  Pulse 82  Resp 20  Ht 5' 11.26" (1.81 m)  Wt 175 lb (79.379 kg)  BMI 24.23 kg/m2 Last Weight:  Wt Readings from Last 1 Encounters:  05/20/15 175 lb  (79.379 kg)   Last Height:   Ht Readings from Last 1 Encounters:  05/20/15 5' 11.26" (1.81 m)    Physical exam:  General: The patient is awake, alert and appears not in acute distress. The patient is well groomed. Head: Normocephalic, atraumatic. Neck is supple. Mallampati 3, neck circumference:16 inches.  The patient wears dentures the upper satisfactory fitting well the bottom plate is not in today, he does have TMJ and a click with opening and closing of the jaw that seems to be right side dominant. There is a click present on the left side as well. There is no delayed swallowing. Cardiovascular:  Regular rate  and rhythm, without  murmurs or carotid bruit, and without distended neck veins. Respiratory: Lungs are clear to auscultation. Skin:  Without evidence of edema, or rash- he is diaphoretic.  Trunk: BMI is elevated and patient has normal posture.  Neurologic exam : The patient is awake and alert, oriented to place and time.  Memory subjective  described as impaired - increasing over the last 9 month .  There is a normal attention span & concentration ability. Speech is fluent without  dysarthria, dysphonia or aphasia. Mood and affect are depressed.  Cranial nerves:  No loss of smell or taste sensation Pupils are equal and briskly reactive to light.  Funduscopic exam without evidence of pallor or edema. Extraocular movements in vertical and horizontal planes intact and with an endpoint nystagmus.  Visual fields by finger perimetry are intact. Hearing to finger rub intact. Facial sensation intact to fine touch. Facial motor strength is symmetric and tongue and uvula move midline. Tongue protrusion into either cheek is normal. Shoulder shrug causes pain, he reports tenderness from the central C 5 region to the left.   Motor exam: Normal tone, muscle bulk . Weaker hip adduction and knee extension on the left lower extremity noted this can be attributed to his previous fusion surgery and  radiculopathy L4-5. Bilateral equal grip strength, normal pinch strengths, symmetric biceps and triceps.  Sensory:  Fine touch, pinprick and vibration were tested in all extremities. Proprioception was normal.vibration was preserved as is fine touch . He reports on the left upper arm, he has a dermatome that equals C3,4 and feels numb.  Coordination: Rapid alternating movements in the fingers/hands were normal. Finger-to-nose maneuver  normal without evidence of ataxia, dysmetria or tremor.  Gait and station: Patient walks without assistive device and is able unassisted to climb up to the exam table.  Strength within normal limits. Stance is stable and normal.  Tandem gait is unfragmented. Romberg testing is negative   Deep tendon reflexes: in the  upper and lower extremities are symmetric and intact. Babinski maneuver response is downgoing.   Assessment:  After physical and neurologic examination, review of laboratory studies, imaging, neurophysiology testing and pre-existing records, assessment is that of :   Subjective memory loss which seems to be confirmed by his score in the Montral cognitive assessment test for a gentleman only 49 years old 52 out of 30 points are below the expected score. The patient has a GED educational level, which allows for one point adduction the normal level here allowed would be 25 out of 30. I would like to add that 3 of the points were lost in the delayed recall words one point in serial 7 subtraction one point in word generation and one point that the patient was not able to draw a three-dimensional cube image.  Consider that depression and anxiety overlay as well as medication can mimic memory loss or can cause temporary memory loss. 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 An MRI of the brain is necessary to further evaluate.  The patient's headaches are a rising from the spine, and radiate into the left upper extremity. Finally he has well-documented  degenerative disc disease and is appropriately followed by neurosurgery, spine surgeon and pain specialist the radiation into the left upper extremity is unexplained by his most current imaging studies. I will refer to one of my neuromuscular colleagues for the appropriate EMG nerve conduction study of the left upper extremity and compares this to the right - paraspinal muscles  will be evaluated as well.  As to his headaches they have a migrainous component was photophobia, blurring of vision but arise from the nape of the neck and radiate towards the head and left upper extremity  Plan:  Treatment plan and additional workup : pain menagement and DDD is not followed by neurology, I will concentrate on Memory loss and obtain MRI Brain. Defer to neuromuscular specialist for neck and arm pain.     Asencion Partridge Rashun Grattan MD   ABPN , ABSM,  MEMBER of the American Academy of Sleep [employer] .  05/20/2015

## 2015-05-21 LAB — COMPREHENSIVE METABOLIC PANEL
ALBUMIN: 4.7 g/dL (ref 3.5–5.5)
ALT: 30 IU/L (ref 0–44)
AST: 25 IU/L (ref 0–40)
Albumin/Globulin Ratio: 2 (ref 1.1–2.5)
Alkaline Phosphatase: 75 IU/L (ref 39–117)
BUN / CREAT RATIO: 13 (ref 9–20)
BUN: 11 mg/dL (ref 6–24)
Bilirubin Total: 0.4 mg/dL (ref 0.0–1.2)
CHLORIDE: 100 mmol/L (ref 97–108)
CO2: 25 mmol/L (ref 18–29)
CREATININE: 0.88 mg/dL (ref 0.76–1.27)
Calcium: 9.7 mg/dL (ref 8.7–10.2)
GFR calc Af Amer: 117 mL/min/{1.73_m2} (ref >59)
GFR calc non Af Amer: 102 mL/min/{1.73_m2} (ref >59)
GLUCOSE: 102 mg/dL — AB (ref 65–99)
Globulin, Total: 2.4 g/dL (ref 1.5–4.5)
POTASSIUM: 4.6 mmol/L (ref 3.5–5.2)
Sodium: 141 mmol/L (ref 134–144)
Total Protein: 7.1 g/dL (ref 6.0–8.5)

## 2015-05-24 ENCOUNTER — Telehealth: Payer: Self-pay | Admitting: *Deleted

## 2015-05-24 NOTE — Telephone Encounter (Signed)
Left VM for pt to let him know I was calling about labs. Gave [facility] phone number and office hours. Labs were unremarkable.

## 2015-05-25 NOTE — Telephone Encounter (Signed)
Spoke with pt about unremarkable lab results. Told him to call back if he had further questions. Pt verbalized understanding.

## 2015-05-25 NOTE — Telephone Encounter (Signed)
Patient returned your call regarding labs.  Please call. Thanks!

## 2015-05-29 ENCOUNTER — Ambulatory Visit
Admission: RE | Admit: 2015-05-29 | Discharge: 2015-05-29 | Disposition: A | Discharge status: Home / Self Care | Payer: Medicare Other | Source: Ambulatory Visit | Attending: Neurology | Admitting: Neurology

## 2015-05-29 DIAGNOSIS — R51 Headache: Secondary | ICD-10-CM

## 2015-05-29 DIAGNOSIS — M5010 Cervical disc disorder with radiculopathy, unspecified cervical region: Secondary | ICD-10-CM

## 2015-05-29 DIAGNOSIS — G3184 Mild cognitive impairment, so stated: Secondary | ICD-10-CM

## 2015-05-29 DIAGNOSIS — R519 Headache, unspecified: Secondary | ICD-10-CM

## 2015-05-29 MED ORDER — GADOBENATE DIMEGLUMINE 529 MG/ML IV SOLN
16.0000 mL | Freq: Once | INTRAVENOUS | Status: AC | PRN
  Administered 2015-05-29: 16 mL via INTRAVENOUS

## 2015-05-31 ENCOUNTER — Telehealth: Payer: Self-pay | Admitting: Neurology

## 2015-05-31 NOTE — Telephone Encounter (Signed)
I called the patient. MRI of the brain was normal. He is to call if he has questions.

## 2015-06-01 ENCOUNTER — Ambulatory Visit (INDEPENDENT_AMBULATORY_CARE_PROVIDER_SITE_OTHER): Payer: Medicare Other | Admitting: Neurology

## 2015-06-01 ENCOUNTER — Encounter: Payer: Self-pay | Admitting: Neurology

## 2015-06-01 DIAGNOSIS — R51 Headache: Secondary | ICD-10-CM

## 2015-06-01 DIAGNOSIS — M5010 Cervical disc disorder with radiculopathy, unspecified cervical region: Secondary | ICD-10-CM

## 2015-06-01 DIAGNOSIS — R202 Paresthesia of skin: Secondary | ICD-10-CM

## 2015-06-01 DIAGNOSIS — R519 Headache, unspecified: Secondary | ICD-10-CM

## 2015-06-01 DIAGNOSIS — Z0289 Encounter for other administrative examinations: Secondary | ICD-10-CM

## 2015-06-01 DIAGNOSIS — G3184 Mild cognitive impairment, so stated: Secondary | ICD-10-CM

## 2015-06-01 NOTE — Procedures (Signed)
     HISTORY:  [name] is a 49 year old gentleman with a 3 to four-month history of discomfort in the neck and primarily in the left shoulder with left occipital headaches. The patient will have some intermittent tingling and discomfort down the left arm. He denies any weakness of the extremities. He is being evaluated for possible cervical radiculopathy.  NERVE CONDUCTION STUDIES:  Nerve conduction studies were performed on both upper extremities. The distal motor latencies and motor amplitudes for the median and ulnar nerves were within normal limits. The F wave latencies and nerve conduction velocities for these nerves were also normal. The sensory latencies for the median and ulnar nerves were normal.   EMG STUDIES:  EMG study was performed on the left upper extremity:  The first dorsal interosseous muscle reveals 2 to 4 K units with full recruitment. No fibrillations or positive waves were noted. The abductor pollicis brevis muscle reveals 2 to 4 K units with full recruitment. No fibrillations or positive waves were noted. The extensor indicis proprius muscle reveals 1 to 3 K units with full recruitment. No fibrillations or positive waves were noted. The pronator teres muscle reveals 2 to 3 K units with full recruitment. No fibrillations or positive waves were noted. The biceps muscle reveals 1 to 2 K units with full recruitment. No fibrillations or positive waves were noted. The triceps muscle reveals 2 to 4 K units with full recruitment. No fibrillations or positive waves were noted. The anterior deltoid muscle reveals 2 to 3 K units with full recruitment. No fibrillations or positive waves were noted. The cervical paraspinal muscles were tested at 2 levels. No abnormalities of insertional activity were seen at either level tested. There was poor relaxation.   IMPRESSION:  Nerve conduction studies done on both upper extremities were within normal limits. No evidence of a  neuropathy is seen. EMG evaluation of the left upper extremity is unremarkable, without evidence of an overlying cervical radiculopathy.  Jill Alexanders MD 06/01/2015 10:06 AM  [facility] [address] [place], [place] [address]  Phone [phone] Fax [phone]

## 2015-06-01 NOTE — Progress Notes (Signed)
Please refer to EMG and NCV procedure note. 

## 2015-06-15 ENCOUNTER — Encounter: Payer: Self-pay | Admitting: Cardiovascular Disease

## 2015-06-17 ENCOUNTER — Ambulatory Visit: Payer: Medicare Other | Admitting: Adult Health

## 2015-07-05 ENCOUNTER — Encounter: Payer: Self-pay | Admitting: Adult Health

## 2015-07-05 ENCOUNTER — Telehealth: Payer: Self-pay

## 2015-07-05 ENCOUNTER — Ambulatory Visit (INDEPENDENT_AMBULATORY_CARE_PROVIDER_SITE_OTHER): Payer: Medicare Other | Admitting: Adult Health

## 2015-07-05 VITALS — BP 126/91 | HR 114 | Ht 71.0 in | Wt 182.0 lb

## 2015-07-05 DIAGNOSIS — R519 Headache, unspecified: Secondary | ICD-10-CM

## 2015-07-05 DIAGNOSIS — R51 Headache: Secondary | ICD-10-CM

## 2015-07-05 DIAGNOSIS — G3184 Mild cognitive impairment, so stated: Secondary | ICD-10-CM | POA: Diagnosis not present

## 2015-07-05 DIAGNOSIS — M5010 Cervical disc disorder with radiculopathy, unspecified cervical region: Secondary | ICD-10-CM

## 2015-07-05 NOTE — Patient Instructions (Signed)
Continue Amitriptyline Memory score has improved.  If your symptoms worsen or you develop new symptoms please let us know.

## 2015-07-05 NOTE — Telephone Encounter (Signed)
-----   Message from Larey Seat, MD sent at 07/05/2015 10:02 AM EDT ----- Normal upper extremity study by EMG and NCV.

## 2015-07-05 NOTE — Progress Notes (Signed)
PATIENT: [name] DOB: 07-Feb-1966  REASON FOR VISIT: follow up-  Cervical disc disorder, mild cognitive impairment , persistent headaches HISTORY FROM: patient  HISTORY OF PRESENT ILLNESS:. [name] is a 49 year old male with a history of  Cervical disc disorder, mild cognitive impairment and persistent headaches. He returns today for follow-up. The patient continues to follow with pain specialist in regards to his cervical disc disorder. Patient feels that his memory has improved since the last visit. The patient feels that his stress level triggers his memory loss. He states that at home he is able to complete all ADLs independently. He operates a Teacher, music without difficulty. The patient completes all of his finances without difficulty. The patient does most of the cooking and does not note any difficulties. The patient is on amitriptyline for depression. He feels that this has been beneficial. The patient states that his headaches have also improved. He states that he was having headaches daily now is having 1 headache a week.  The patient denies any new neurological symptoms. He returns today for an evaluation.  HISTORY 05/20/15 [facility]): [name] is a 49 y.o. male, seen here as a referral from Dr. Lisbeth Ply for "dizziness" , 05/20/2015 To be evaluated for headaches associated with dizziness.. The patient is an established pain clinic patient and also followed by surgeon, Dr. Rennis Harding. He has been disabled since his lumbar fusion 2010, at the age of 2. He underwent a lumbar disc fusion L4-5/ 6 in 2010. Over the last 10 months about September 2015 he has noted increasing neck problems and stiffness. He was diagnosed with a bulging disc in the cervical spine C5-C6.This protrudes medial and to the right side of the spinal cord. He underwent an MRI on 04-11-15 which is a repeat study Documenting a disc protrusion between the fifth and sixth cervical vertebrae a central  and right-sided protrusion with uncinate spurring compressing the right C6 nerve root -impingement is still seen. Dr.: Referred him to pain specialist who in turn started gabapentin and physical therapy. The hope was to avoid surgery. Because of the complaints of headache and dizziness an MRI of the head would have been in order but a CT without contrast was ordered instead which shows no intracranial abnormalities and a normal brain on 05-11-15 it is Dr. Lisbeth Ply now requested this evaluation for headache and dizziness. The patient will remain treated for neck pain back pain and any kind of degenerative spinal joint disorder with his orthopedist and pain specialist Dr. Milas Gain low.  The patient added that he has been suffering from anxiety and insomnia and he has been treated with Lexapro and zolpidem ( insomnia, non organic ) . He denied suicidal ideation. He developed cardiac palpitation and a cardiology workup was normal including a stress test.  The patient also reports confusion or disorientation problems memory perhaps inattentiveness at times this has preceded his medication regimen and that actually preceded his neck pain complaints. He score today on a Montral cognitive assessment test on 05-20-15, scoring 24 out of 30 points , which is less than expected for his peer group.  His memory concerns are chief concerns,he has also chronic headaches. His headaches begin in the retro-orbital region bilaterally a feeling of pressure behind the eyes, especially the medial part close to the nasal bridge, radiating from there to the occipital region and into the paraspinal area left shoulder and left arm.  Vision gets blurry with these headaches, he reports photosensitivity with  his headaches, he has been eating less but he denies any nausea associated with the headaches. He seems to have no visual aura or fortification lines or flickering lights. He does not report any sensitivity to sounds. He states that all  his headaches seem to radiates towards the spine and to the left side of the upper body this is curious in light of his MRI report from stating that all his C6 compression would affect the right.   He is still in his physical therapy cycle, and then this is completed he will be followed up by Dr. Maia Petties.  Dr. Lisbeth Ply requested Dr. Ellene Route for a second opinion for the left upper extremity pain and to review the cervical MRI and consider nerve conduction studies.  / REVIEW OF SYSTEMS: Out of a complete 14 system review of symptoms, the patient complains only of the following symptoms, and all other reviewed systems are negative.   ringing in ears, chest tightness, palpitations, insomnia, back pain, neck pain, dizziness, headache, difficulty urinating  ALLERGIES: No Known Allergies  HOME MEDICATIONS: Outpatient Prescriptions Prior to Visit  Medication Sig Dispense Refill  . amitriptyline (ELAVIL) 25 MG tablet Take 1 tablet (25 mg total) by mouth at bedtime. 30 tablet 1  . escitalopram (LEXAPRO) 10 MG tablet Take 1 tablet by mouth daily. Take 1 tab daily    . traMADol (ULTRAM) 50 MG tablet Take 50 mg by mouth every 6 (six) hours as needed.    . zolpidem (AMBIEN) 10 MG tablet Take 1 tablet by mouth at bedtime. Take 0.5 to 1 tab at bedtime as needed  2  . gabapentin (NEURONTIN) 300 MG capsule Take 1 capsule by mouth daily. Take 1-2 tabs per day as needed for pain  1   No facility-administered medications prior to visit.    PAST MEDICAL HISTORY: Past Medical History  Diagnosis Date  . H/O testicular cancer 1993    s/p R orchiectomy w/ LN dissection, Chemo  . Anxiety disorder   . HNP (herniated nucleus pulposus)   . GERD (gastroesophageal reflux disease)   . Palpitations   . Subclinical hypothyroidism   . Testicular malignancy     PAST SURGICAL HISTORY: Past Surgical History  Procedure Laterality Date  . Back surgery  2010    Lumber fusion  . Hand surgery  1988  . Nm myoview ltd   03/23/2015    NORMAL Lexiscan Myoview: No ST segment changes. No evidence of ischemia or infarction. Normal function.    FAMILY HISTORY: Family History  Problem Relation Age of Onset  . Cancer Father 88  . Cancer Brother 57  . Diabetes Brother   . Coronary artery disease Brother     PCI  . Heart attack Brother 80  . Seizures Brother     SOCIAL HISTORY: History   Social History  . Marital Status: Married    Spouse Name: [name]  . Number of Children: 1  . Years of Education: N/A   Occupational History  . Not on file.   Social History Main Topics  . Smoking status: Former Smoker    Quit date: 03/07/2008  . Smokeless tobacco: Never Used  . Alcohol Use: 0.0 oz/week    0 Standard drinks or equivalent per week     Comment: very seldom  . Drug Use: No  . Sexual Activity: Not on file   Other Topics Concern  . Not on file   Social History Narrative   Patient lives at home with his  wife ([name])   Patient is disabled.   Education GED.   Right handed.    One cup of coffee  Daily.       PHYSICAL EXAM  Filed Vitals:   07/05/15 0953  BP: 126/91  Pulse: 114  Height: 5\' 11"  (1.803 m)  Weight: 182 lb (82.555 kg)   Body mass index is 25.4 kg/(m^2).  Generalized: Well developed, in no acute distress   Neurological examination  Mentation: Alert oriented to time, place, history taking. Follows all commands speech and language fluent. MOCA 27/30 Cranial nerve II-XII: Pupils were equal round reactive to light. Extraocular movements were full, visual field were full on confrontational test. Facial sensation and strength were normal. Uvula tongue midline. Head turning and shoulder shrug  were normal and symmetric. Motor: The motor testing reveals 5 over 5 strength of all 4 extremities. Good symmetric motor tone is noted throughout.  Sensory: Sensory testing is intact to soft touch on all 4 extremities. No evidence of extinction is noted.  Coordination: Cerebellar testing  reveals good finger-nose-finger and heel-to-shin bilaterally.  Gait and station: Gait is normal. Tandem gait is normal. Romberg is negative. No drift is seen.  Reflexes: Deep tendon reflexes are symmetric and normal bilaterally.   DIAGNOSTIC DATA (LABS, IMAGING, TESTING) - I reviewed patient records, labs, notes, testing and imaging myself where available.    ASSESSMENT AND PLAN 49 y.o. year old male  has a past medical history of H/O testicular cancer (1993); Anxiety disorder; HNP (herniated nucleus pulposus); GERD (gastroesophageal reflux disease); Palpitations; Subclinical hypothyroidism; and Testicular malignancy. here with:   1. Mild cognitive impairment  2. Cervical disc disorder 3. Headaches   Overall the patient symptoms have improved. His MOCA today is 27/30 was previously 24/30.  We will continue to monitor his memory.  The patient will continue on amitriptyline for his depression. The patient's headaches have improved. He is now only having 1 headache a week. The patient follows with a pain specialist for his cervical disc disorder. Patient advised that if his symptoms worsen or he develops new symptoms he should let us know. He will follow-up in 6 months or sooner if needed.     Ward Givens, MSN, NP-C 07/05/2015, 10:12 AM [facility] [address] [place], [place] [address] [phone]  Note: This document was prepared with digital dictation and possible smart phrase technology. Any transcriptional errors that result from this process are unintentional.

## 2015-07-05 NOTE — Telephone Encounter (Signed)
Called pt to tell him that his MRI and NCV are normal. Left a message per DPR and asked him to call back with any questions.

## 2015-07-06 NOTE — Progress Notes (Signed)
I agree with the assessment and plan as directed by NP .The patient is known to me .   Latisa Belay, MD  

## 2015-07-14 ENCOUNTER — Other Ambulatory Visit: Payer: Self-pay | Admitting: Neurology

## 2015-08-30 DIAGNOSIS — J069 Acute upper respiratory infection, unspecified: Secondary | ICD-10-CM | POA: Diagnosis not present

## 2015-11-02 DIAGNOSIS — R42 Dizziness and giddiness: Secondary | ICD-10-CM | POA: Diagnosis not present

## 2015-11-02 DIAGNOSIS — G4700 Insomnia, unspecified: Secondary | ICD-10-CM | POA: Diagnosis not present

## 2015-11-02 DIAGNOSIS — Z23 Encounter for immunization: Secondary | ICD-10-CM | POA: Diagnosis not present

## 2015-11-02 DIAGNOSIS — F418 Other specified anxiety disorders: Secondary | ICD-10-CM | POA: Diagnosis not present

## 2015-11-02 DIAGNOSIS — R002 Palpitations: Secondary | ICD-10-CM | POA: Diagnosis not present

## 2015-12-20 DIAGNOSIS — Z79891 Long term (current) use of opiate analgesic: Secondary | ICD-10-CM | POA: Diagnosis not present

## 2015-12-20 DIAGNOSIS — G894 Chronic pain syndrome: Secondary | ICD-10-CM | POA: Diagnosis not present

## 2015-12-20 DIAGNOSIS — Z79899 Other long term (current) drug therapy: Secondary | ICD-10-CM | POA: Diagnosis not present

## 2016-01-05 ENCOUNTER — Ambulatory Visit (INDEPENDENT_AMBULATORY_CARE_PROVIDER_SITE_OTHER): Payer: Medicare Other | Admitting: Adult Health

## 2016-01-05 ENCOUNTER — Encounter: Payer: Self-pay | Admitting: Adult Health

## 2016-01-05 VITALS — BP 140/90 | HR 86 | Ht 71.0 in | Wt 200.0 lb

## 2016-01-05 DIAGNOSIS — R51 Headache: Secondary | ICD-10-CM

## 2016-01-05 DIAGNOSIS — R519 Headache, unspecified: Secondary | ICD-10-CM

## 2016-01-05 DIAGNOSIS — G3184 Mild cognitive impairment, so stated: Secondary | ICD-10-CM | POA: Diagnosis not present

## 2016-01-05 NOTE — Progress Notes (Signed)
PATIENT: [name] DOB: 09/14/1966  REASON FOR VISIT: follow up-mild cognitive impairment, headaches HISTORY FROM: patient  HISTORY OF PRESENT ILLNESS: Mr. [name] is a 50 year old male with a history of mild cognitive impairment and headaches. He returns today for follow-up. The patient feels that his memory has remained the same. He is able to complete all ADLs independently. He operates a Teacher, music without difficulty. He continues to be able to complete his finances and prepare his meals without difficulty. He remains on amitriptyline for depression. The patient feels that his headaches are under good control. He has approximately 1 headache a week. The headaches usually come from neck pain. He still follows with a pain specialist for control of his neck pain. He returns today for an evaluation.  HISTORY 07/05/15: Mr. [name] is a 50 year old male with a history of Cervical disc disorder, mild cognitive impairment and persistent headaches. He returns today for follow-up. The patient continues to follow with pain specialist in regards to his cervical disc disorder. Patient feels that his memory has improved since the last visit. The patient feels that his stress level triggers his memory loss. He states that at home he is able to complete all ADLs independently. He operates a Teacher, music without difficulty. The patient completes all of his finances without difficulty. The patient does most of the cooking and does not note any difficulties. The patient is on amitriptyline for depression. He feels that this has been beneficial. The patient states that his headaches have also improved. He states that he was having headaches daily now is having 1 headache a week. The patient denies any new neurological symptoms. He returns today for an evaluation.  HISTORY 05/20/15 [facility] [name] is a 50 y.o. male, seen here as a referral from Dr. Lisbeth Ply for "dizziness" , 05/20/2015 To  be evaluated for headaches associated with dizziness.. The patient is an established pain clinic patient and also followed by surgeon, Dr. Rennis Harding. He has been disabled since his lumbar fusion 2010, at the age of 55. He underwent a lumbar disc fusion L4-5/ 6 in 2010. Over the last 10 months about September 2015 he has noted increasing neck problems and stiffness. He was diagnosed with a bulging disc in the cervical spine C5-C6.This protrudes medial and to the right side of the spinal cord. He underwent an MRI on 04-11-15 which is a repeat study Documenting a disc protrusion between the fifth and sixth cervical vertebrae a central and right-sided protrusion with uncinate spurring compressing the right C6 nerve root -impingement is still seen. Dr.: Referred him to pain specialist who in turn started gabapentin and physical therapy. The hope was to avoid surgery. Because of the complaints of headache and dizziness an MRI of the head would have been in order but a CT without contrast was ordered instead which shows no intracranial abnormalities and a normal brain on 05-11-15 it is Dr. Lisbeth Ply now requested this evaluation for headache and dizziness. The patient will remain treated for neck pain back pain and any kind of degenerative spinal joint disorder with his orthopedist and pain specialist Dr. Milas Gain low.  The patient added that he has been suffering from anxiety and insomnia and he has been treated with Lexapro and zolpidem ( insomnia, non organic ) . He denied suicidal ideation. He developed cardiac palpitation and a cardiology workup was normal including a stress test.  The patient also reports confusion or disorientation problems memory perhaps inattentiveness at times this  has preceded his medication regimen and that actually preceded his neck pain complaints. He score today on a Montral cognitive assessment test on 05-20-15, scoring 24 out of 30 points , which is less than expected for his peer group.   His memory concerns are chief concerns,he has also chronic headaches. His headaches begin in the retro-orbital region bilaterally a feeling of pressure behind the eyes, especially the medial part close to the nasal bridge, radiating from there to the occipital region and into the paraspinal area left shoulder and left arm.  Vision gets blurry with these headaches, he reports photosensitivity with his headaches, he has been eating less but he denies any nausea associated with the headaches. He seems to have no visual aura or fortification lines or flickering lights. He does not report any sensitivity to sounds. He states that all his headaches seem to radiates towards the spine and to the left side of the upper body this is curious in light of his MRI report from stating that all his C6 compression would affect the right.  He is still in his physical therapy cycle, and then this is completed he will be followed up by Dr. Maia Petties. Dr. Lisbeth Ply requested Dr. Ellene Route for a second opinion for the left upper extremity pain and to review the cervical MRI and consider nerve conduction studies.  REVIEW OF SYSTEMS: Out of a complete 14 system review of symptoms, the patient complains only of the following symptoms, and all other reviewed systems are negative.  Ringing in ears, blurred vision, palpitations, insomnia, back pain, neck pain, depression, dizziness, headache, difficulty urinating  ALLERGIES: No Known Allergies  HOME MEDICATIONS: Outpatient Prescriptions Prior to Visit  Medication Sig Dispense Refill  . amitriptyline (ELAVIL) 25 MG tablet TAKE 1 TABLET BY MOUTH AT BEDTIME 90 tablet 1  . escitalopram (LEXAPRO) 10 MG tablet Take 1 tablet by mouth daily. Take 1 tab daily    . traMADol (ULTRAM) 50 MG tablet Take 50 mg by mouth every 6 (six) hours as needed.    . zolpidem (AMBIEN) 10 MG tablet Take 1 tablet by mouth at bedtime. Take 0.5 to 1 tab at bedtime as needed  2   No facility-administered  medications prior to visit.    PAST MEDICAL HISTORY: Past Medical History  Diagnosis Date  . H/O testicular cancer 1993    s/p R orchiectomy w/ LN dissection, Chemo  . Anxiety disorder   . HNP (herniated nucleus pulposus)   . GERD (gastroesophageal reflux disease)   . Palpitations   . Subclinical hypothyroidism   . Testicular malignancy ([place])     PAST SURGICAL HISTORY: Past Surgical History  Procedure Laterality Date  . Back surgery  2010    Lumber fusion  . Hand surgery  1988  . Nm myoview ltd  03/23/2015    NORMAL Lexiscan Myoview: No ST segment changes. No evidence of ischemia or infarction. Normal function.    FAMILY HISTORY: Family History  Problem Relation Age of Onset  . Cancer Father 53  . Cancer Brother 71  . Diabetes Brother   . Coronary artery disease Brother     PCI  . Heart attack Brother 5  . Seizures Brother     SOCIAL HISTORY: Social History   Social History  . Marital Status: Married    Spouse Name: [name]  . Number of Children: 1  . Years of Education: N/A   Occupational History  . Not on file.   Social History Main Topics  .  Smoking status: Former Smoker    Quit date: 03/07/2008  . Smokeless tobacco: Never Used  . Alcohol Use: 0.0 oz/week    0 Standard drinks or equivalent per week     Comment: very seldom  . Drug Use: No  . Sexual Activity: Not on file   Other Topics Concern  . Not on file   Social History Narrative   Patient lives at home with his wife Lynelle Smoke)   Patient is disabled.   Education GED.   Right handed.    One cup of coffee  Daily.       PHYSICAL EXAM  Filed Vitals:   01/05/16 0947  BP: 140/90  Pulse: 86  Height: 5\' 11"  (1.803 m)  Weight: 200 lb (90.719 kg)   Body mass index is 27.91 kg/(m^2). MMSE - Mini Mental State Exam 01/05/2016  Orientation to time 5  Orientation to Place 5  Registration 3  Attention/ Calculation 5  Recall 2  Language- name 2 objects 2  Language- repeat 1  Language- follow  3 step command 3  Language- read & follow direction 1  Write a sentence 1  Copy design 1  Total score 29    Generalized: Well developed, in no acute distress   Neurological examination  Mentation: Alert oriented to time, place, history taking. Follows all commands speech and language fluent Cranial nerve II-XII: Pupils were equal round reactive to light. Extraocular movements were full, visual field were full on confrontational test. Facial sensation and strength were normal. Uvula tongue midline. Head turning and shoulder shrug  were normal and symmetric. Motor: The motor testing reveals 5 over 5 strength of all 4 extremities. Good symmetric motor tone is noted throughout.  Sensory: Sensory testing is intact to soft touch on all 4 extremities. No evidence of extinction is noted.  Coordination: Cerebellar testing reveals good finger-nose-finger and heel-to-shin bilaterally.  Gait and station: Gait is normal. Tandem gait is normal. Romberg is negative. No drift is seen.  Reflexes: Deep tendon reflexes are symmetric and normal bilaterally.   DIAGNOSTIC DATA (LABS, IMAGING, TESTING) - I reviewed patient records, labs, notes, testing and imaging myself where available.       ASSESSMENT AND PLAN 50 y.o. year old male  has a past medical history of H/O testicular cancer (1993); Anxiety disorder; HNP (herniated nucleus pulposus); GERD (gastroesophageal reflux disease); Palpitations; Subclinical hypothyroidism; and Testicular malignancy ([place]). here with:  1. Mild cognitive impairment  2. Headaches  Overall the patient is doing well. His memory score has remained stable. MMSE today is 29/30. We will continue to monitor his memory. The patient's headaches have also remained stable. Patient advised that if his symptoms worsen or he develops new symptoms he should let us know. He will follow-up in one year with Dr. Mechele Claude, MSN, NP-C 01/05/2016, 9:53 AM [facility] [address], [place], [place] [address] [phone]

## 2016-01-05 NOTE — Progress Notes (Signed)
I agree with the assessment and plan as directed by NP .The patient is known to me .   Sadao Weyer, MD  

## 2016-01-05 NOTE — Patient Instructions (Signed)
Memory score is stable If your symptoms worsen or you develop new symptoms please let us know.   

## 2016-01-07 ENCOUNTER — Other Ambulatory Visit: Payer: Self-pay | Admitting: Neurology

## 2016-03-29 DIAGNOSIS — Z23 Encounter for immunization: Secondary | ICD-10-CM | POA: Diagnosis not present

## 2016-03-29 DIAGNOSIS — G4700 Insomnia, unspecified: Secondary | ICD-10-CM | POA: Diagnosis not present

## 2016-07-07 ENCOUNTER — Other Ambulatory Visit: Payer: Self-pay | Admitting: Neurology

## 2016-09-11 DIAGNOSIS — Z79899 Other long term (current) drug therapy: Secondary | ICD-10-CM | POA: Diagnosis not present

## 2016-09-11 DIAGNOSIS — Z79891 Long term (current) use of opiate analgesic: Secondary | ICD-10-CM | POA: Diagnosis not present

## 2016-09-11 DIAGNOSIS — G894 Chronic pain syndrome: Secondary | ICD-10-CM | POA: Diagnosis not present

## 2016-09-25 DIAGNOSIS — J209 Acute bronchitis, unspecified: Secondary | ICD-10-CM | POA: Diagnosis not present

## 2016-12-30 ENCOUNTER — Other Ambulatory Visit: Payer: Self-pay | Admitting: Neurology

## 2017-01-04 ENCOUNTER — Ambulatory Visit: Payer: Medicare Other | Admitting: Neurology

## 2017-03-19 IMAGING — NM NM MISC PROCEDURE
6 series · 36 of 36 positions shown · non-contrast
Comparison: none

[Series 1: wbr rest · 6.40mm/px · 6 of 64 frames shown]
[frame 6/64]
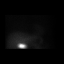
[frame 16/64]
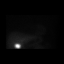
[frame 27/64]
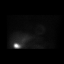
[frame 38/64]
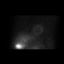
[frame 48/64]
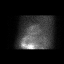
[frame 59/64]
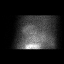

[Series 1: wbr_r-proj_st wbr rest · 6.40mm/px · 6 of 64 frames shown]
[frame 6/64]
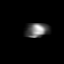
[frame 16/64]
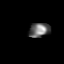
[frame 27/64]
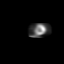
[frame 38/64]
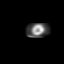
[frame 48/64]
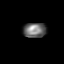
[frame 59/64]
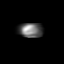

[Series 2: wbr stress-gsp · 6.40mm/px · 6 of 512 frames shown]
[frame 43/512]
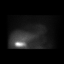
[frame 128/512]
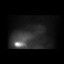
[frame 214/512]
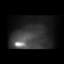
[frame 299/512]
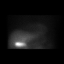
[frame 384/512]
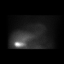
[frame 470/512]
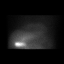

[Series 2: wbr_s-proj_st wbr stress-gsp · 6.40mm/px · 6 of 512 frames shown]
[frame 43/512]
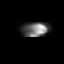
[frame 128/512]
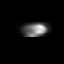
[frame 214/512]
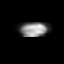
[frame 299/512]
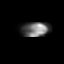
[frame 384/512]
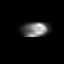
[frame 470/512]
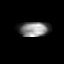

[Series 3: wbr_s-proj_st wbr stress-sum-em · 6.40mm/px · 6 of 64 frames shown]
[frame 6/64]
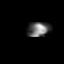
[frame 16/64]
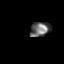
[frame 27/64]
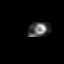
[frame 38/64]
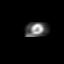
[frame 48/64]
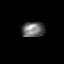
[frame 59/64]
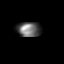

[Series 3: wbr stress-sum-em · 6.40mm/px · 6 of 64 frames shown]
[frame 6/64]
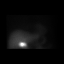
[frame 16/64]
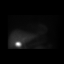
[frame 27/64]
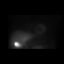
[frame 38/64]
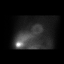
[frame 48/64]
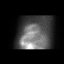
[frame 59/64]
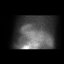

[36 of 36 positions shown; findings below may reference images not displayed]

Canned report from images found in remote index.

Refer to host system for actual result text.

## 2017-03-31 ENCOUNTER — Other Ambulatory Visit: Payer: Self-pay | Admitting: Neurology

## 2017-04-25 ENCOUNTER — Telehealth: Payer: Self-pay | Admitting: Neurology

## 2017-04-25 NOTE — Telephone Encounter (Signed)
I called pt. I explained to him that Dr. Brett Fairy does not have an opening until September. An appt was made for him in September but pt is wanting to be called if a sooner appt comes available. I have placed him on the waitlist. Pt verbalized understanding.

## 2017-04-25 NOTE — Telephone Encounter (Signed)
I called [name] sooner appt opened on 05/01/2017 at 11:00am. Pt is agreeable to this and wants the September appt cancelled. Pt verbalized understanding of new appt date and time.

## 2017-04-25 NOTE — Telephone Encounter (Signed)
Pt reports new onset of pulsating "jolt" in the shoulders, neck and head. He said it is a certain way he moves getting worse over the past week. Pt is still under the care of pain management. He is requesting to see Dr Keturah Barre

## 2017-05-01 ENCOUNTER — Encounter: Payer: Self-pay | Admitting: Neurology

## 2017-05-01 ENCOUNTER — Ambulatory Visit (INDEPENDENT_AMBULATORY_CARE_PROVIDER_SITE_OTHER): Payer: Medicare Other | Admitting: Neurology

## 2017-05-01 VITALS — BP 140/80 | HR 100 | Ht 71.0 in | Wt 195.0 lb

## 2017-05-01 DIAGNOSIS — M542 Cervicalgia: Secondary | ICD-10-CM

## 2017-05-01 DIAGNOSIS — M5440 Lumbago with sciatica, unspecified side: Secondary | ICD-10-CM | POA: Diagnosis not present

## 2017-05-01 DIAGNOSIS — G8929 Other chronic pain: Secondary | ICD-10-CM

## 2017-05-01 DIAGNOSIS — F321 Major depressive disorder, single episode, moderate: Secondary | ICD-10-CM

## 2017-05-01 MED ORDER — BACLOFEN 10 MG PO TABS: 10.0000 mg | ORAL_TABLET | Freq: Every evening | ORAL | 2 refills | Status: DC | PRN

## 2017-05-01 NOTE — Progress Notes (Signed)
PATIENT: [name] DOB: [birth year]  REASON FOR VISIT: follow up-mild cognitive impairment, headaches HISTORY FROM: patient  HISTORY OF PRESENT ILLNESS:  I see [name] today in a follow-up for [name] on 05/01/2017. 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 He still has neck stiffness especially between the shoulder blades and lower back pain. He also reports itching. He does not have motor function loss as much as rigidity or a feeling of loss of range of motion. He was placed on Cymbalta by Cyndi Bender, his primary care provider now. He experienced since electric shock sensations especially the first 6 days of treatment. He is worried that these are caused by Cymbalta, which they could be. He has failed Lyrica, gabapentin and other medications in the past to help with back pain, and his pain has affected his ability to sleep. He would be considered a failed back surgery patient. We also performed a memory test today and again the patient shows 29 out of 30 points, and we will not need to follow-up on these complains any longer. I think he is significantly depressed and frustrated. I would like for him to be seen by a psychologist, I would also refer him for designated pain therapy. He has still never seen [name].   [name] is a 51 year old male with a history of mild cognitive impairment and headaches. He returns today for follow-up. The patient feels that his memory has remained the same. He is able to complete all ADLs independently. He operates a Teacher, music without difficulty. He continues to be able to complete his finances and prepare his meals without difficulty. He remains on amitriptyline for depression. The patient feels that his headaches are under good control. He has approximately 1 headache a week. The headaches usually come from  neck pain. He still follows with a pain specialist for control of his neck pain. He returns today for an evaluation.  HISTORY 05/20/15 [name]): [name] is a 51 y.o. male, seen here as a referral from [name] for "dizziness" , 05/20/2015 To be evaluated for headaches associated with dizziness.. The patient is an established pain clinic patient and also followed by surgeon, [name]. He has been disabled since his lumbar fusion 2010, at the age of 68. He underwent a lumbar disc fusion L4-5/ 6 in 2010. Over the last 10 months about September 2015 he has noted increasing neck problems and stiffness. He was diagnosed with a bulging disc in the cervical spine C5-C6.This protrudes medial and to the right side of the spinal cord. He underwent an MRI on 04-11-15 which is a repeat study Documenting a disc protrusion between the fifth and sixth cervical vertebrae a central and right-sided protrusion with uncinate spurring compressing the right C6 nerve root -impingement is still seen. Ryan.: Referred him to pain specialist who in turn started gabapentin and physical therapy. The hope was to avoid surgery. Because of the complaints of headache and dizziness an MRI of the head would have been in order but a CT without contrast was ordered instead which shows no intracranial abnormalities and a normal brain on 05-11-15 it is [name] now requested this evaluation for headache and dizziness. The patient will remain treated for neck pain back pain and any kind of degenerative spinal joint disorder with his orthopedist  and pain specialist [name].  The patient added that he has been suffering from anxiety and insomnia and he has been treated with Lexapro and zolpidem ( insomnia, non organic ) . He denied suicidal ideation. He developed cardiac palpitation and a cardiology workup was normal including a stress test.  The patient also reports confusion or disorientation problems memory perhaps  inattentiveness at times this has preceded his medication regimen and that actually preceded his neck pain complaints. He score today on a Montral cognitive assessment test on 05-20-15, scoring 24 out of 30 points , which is less than expected for his peer group. His memory concerns are chief concerns,he has also chronic headaches. His headaches begin in the retro-orbital region bilaterally a feeling of pressure behind the eyes, especially the medial part close to the nasal bridge, radiating from there to the occipital region and into the paraspinal area left shoulder and left arm.  Vision gets blurry with these headaches, he reports photosensitivity with his headaches, he has been eating less but he denies any nausea associated with the headaches. He seems to have no visual aura or fortification lines or flickering lights. He does not report any sensitivity to sounds. He states that all his headaches seem to radiates towards the spine and to the left side of the upper body this is curious in light of his MRI report from stating that all his C6 compression would affect the right.  He is still in his physical therapy cycle, and then this is completed he will be followed up by [name]. [name] requested [name] for a second opinion for the left upper extremity pain and to review the cervical MRI and consider nerve conduction studies.  REVIEW OF SYSTEMS: Out of a complete 14 system review of symptoms, the patient complains only of the following symptoms, and all other reviewed systems are negative.  Ringing in ears, blurred vision, palpitations, insomnia, back pain, neck pain, depression, dizziness, headache, difficulty urinating  ALLERGIES: No Known Allergies  HOME MEDICATIONS: Outpatient Medications Prior to Visit  Medication Sig Dispense Refill  . traMADol (ULTRAM) 50 MG tablet Take 50 mg by mouth every 6 (six) hours as needed.    . zolpidem (AMBIEN) 10 MG tablet Take 1 tablet by mouth at  bedtime. Take 0.5 to 1 tab at bedtime as needed  2  . amitriptyline (ELAVIL) 25 MG tablet TAKE 1 TABLET BY MOUTH AT BEDTIME 90 tablet 0  . escitalopram (LEXAPRO) 10 MG tablet Take 1 tablet by mouth daily. Take 1 tab daily     No facility-administered medications prior to visit.     PAST MEDICAL HISTORY: Past Medical History:  Diagnosis Date  . Anxiety disorder   . GERD (gastroesophageal reflux disease)   . H/O testicular cancer 1993   s/p R orchiectomy w/ LN dissection, Chemo  . HNP (herniated nucleus pulposus)   . Palpitations   . Subclinical hypothyroidism   . Testicular malignancy ([place])     PAST SURGICAL HISTORY: Past Surgical History:  Procedure Laterality Date  . BACK SURGERY  2010   Lumber fusion  . HAND SURGERY  1988  . NM MYOVIEW LTD  03/23/2015   NORMAL Lexiscan Myoview: No ST segment changes. No evidence of ischemia or infarction. Normal function.    FAMILY HISTORY: Family History  Problem Relation Age of Onset  . Cancer Father 55  . Cancer Brother 75  . Diabetes Brother   . Coronary artery disease Brother  PCI  . Heart attack Brother 60  . Seizures Brother     SOCIAL HISTORY: Social History   Social History  . Marital status: Married    Spouse name: [name]  . Number of children: 1  . Years of education: N/A   Occupational History  . Not on file.   Social History Main Topics  . Smoking status: Former Smoker    Quit date: 03/07/2008  . Smokeless tobacco: Never Used  . Alcohol use 0.0 oz/week     Comment: very seldom  . Drug use: No  . Sexual activity: Not on file   Other Topics Concern  . Not on file   Social History Narrative   Patient lives at home with his wife Lynelle Smoke)   Patient is disabled.   Education GED.   Right handed.    One cup of coffee  Daily.       PHYSICAL EXAM  Vitals:   05/01/17 1102  BP: 140/80  Pulse: 100  Weight: 195 lb (88.5 kg)  Height: 5\' 11"  (1.803 m)   Body mass index is 27.2 kg/m. MMSE - Mini  Mental State Exam 05/01/2017 01/05/2016  Orientation to time 5 5  Orientation to Place 5 5  Registration 3 3  Attention/ Calculation 5 5  Recall 3 2  Language- name 2 objects 2 2  Language- repeat 0 1  Language- follow 3 step command 3 3  Language- read & follow direction 1 1  Write a sentence 1 1  Copy design 1 1  Total score 29 29    Generalized: Well developed, appears depressed.  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   Neurological examination  Mentation: Alert oriented to time, place, history taking. Follows all commands speech and language fluent Cranial nerve; change in taste - metallic, (caused loss of weight ). Pupils were equal round reactive to light.Facial sensation and strength were normal. Uvula tongue midline. Head turning and shoulder shrug  were normal and symmetric. Motor:  5 / 5 strength of all 4 extremities with symmetric motor tone  noted throughout.  Sensory: Sensory testing is intact to soft touch on all 4 extremities. Coordination: Normal  finger-nose-fingerbilaterally.  Gait and station: Gait is normal. Tandem gait is normal. Romberg is negative. No drift is seen.  Reflexes: has bilaterally normal 2 plus patella and achilles tendon reflexes.   DIAGNOSTIC DATA (LABS, IMAGING, TESTING) - I reviewed patient records, labs, notes, testing and imaging myself where available.       ASSESSMENT AND PLAN 51 y.o. year old male  has a past medical history of Anxiety disorder; GERD (gastroesophageal reflux disease); H/O testicular cancer (1993); HNP (herniated nucleus pulposus); Palpitations; Subclinical hypothyroidism; and Testicular malignancy ([place]). here with:  1. Mild cognitive impairment in the past. He was only tested by MMSE 29-30. Not MOCA . He has anxiety and depression.  2. Insomnia with pain. Has lower back pain- has to see orthopedist or pain treatment. He has been in pain since 2009.  3. Headaches, with  electric shock quality . Tinnitus on Cymbalta . Patient suspects that Cymbalta caused these electric shock sensations with movement. I want him to wean off cymbalta. He has been failed by Lyrica, gabapentin. I will let him try baclofen po at night , 10 mg , prn second dose. Takes naproxn daily.    His memory score has remained stable. MMSE today is 29/30.  Patient referred  back to orthopedist - he had spine surgery .  Refer to psychiatry for anxiety and depression.  ambien not longer working, will d/c .   He will follow-up for headaches only in 2-3 with NP . No memory follow up needed.    05/01/2017, 11:40 AM [facility] [address], [place],  [address] [phone]

## 2017-05-01 NOTE — Patient Instructions (Signed)
Analgesic Rebound Headache An analgesic rebound headache, sometimes called a medication overuse headache, is a headache that comes after pain medicine (analgesic) taken to treat the original (primary) headache has worn off. Any type of primary headache can return as a rebound headache if a person regularly takes analgesics more than three times a week to treat it. The types of primary headaches that are commonly associated with rebound headaches include:  Migraines.  Headaches that arise from tense muscles in the head and neck area (tension headaches).  Headaches that develop and happen again (recur) on one side of the head and around the eye (cluster headaches).  If rebound headaches continue, they become chronic daily headaches. What are the causes? This condition may be caused by frequent use of:  Over-the-counter medicines such as aspirin, ibuprofen, and acetaminophen.  Sinus relief medicines and other medicines that contain caffeine.  Narcotic pain medicines such as codeine and oxycodone.  What are the signs or symptoms? The symptoms of a rebound headache are the same as the symptoms of the original headache. Some of the symptoms of specific types of headaches include: Migraine headache  Pulsing or throbbing pain on one or both sides of the head.  Severe pain that interferes with daily activities.  Pain that is worsened by physical activity.  Nausea, vomiting, or both.  Pain with exposure to bright light, loud noises, or strong smells.  General sensitivity to bright light, loud noises, or strong smells.  Visual changes.  Numbness of one or both arms. Tension headache  Pressure around the head.  Dull, aching head pain.  Pain felt over the front and sides of the head.  Tenderness in the muscles of the head, neck, and shoulders. Cluster headache  Severe pain that begins in or around one eye or temple.  Redness and tearing in the eye on the same side as the  pain.  Droopy or swollen eyelid.  One-sided head pain.  Nausea.  Runny nose.  Sweaty, pale facial skin.  Restlessness. How is this diagnosed? This condition is diagnosed by:  Reviewing your medical history. This includes the nature of your primary headaches.  Reviewing the types of pain medicines that you have been using to treat your headaches and how often you take them.  How is this treated? This condition may be treated or managed by:  Discontinuing frequent use of the analgesic medicine. Doing this may worsen your headaches at first, but the pain should eventually become more manageable, less frequent, and less severe.  Seeing a headache specialist. He or she may be able to help you manage your headaches and help make sure there is not another cause of the headaches.  Using methods of stress relief, such as acupuncture, counseling, biofeedback, and massage. Talk with your health care provider about which methods might be good for you.  Follow these instructions at home:  Take over-the-counter and prescription medicines only as told by your health care provider.  Stop the repeated use of pain medicine as told by your health care provider. Stopping can be difficult. Carefully follow instructions from your health care provider.  Avoid triggers that are known to cause your primary headaches.  Keep all follow-up visits as told by your health care provider. This is important. Contact a health care provider if:  You continue to experience headaches after following treatments that your health care provider recommended. Get help right away if:  You develop new headache pain.  You develop headache pain that is different   than what you have experienced in the past.  You develop numbness or tingling in your arms or legs.  You develop changes in your speech or vision. This information is not intended to replace advice given to you by your health care provider. Make sure you  discuss any questions you have with your health care provider. Document Released: 02/03/2004 Document Revised: 06/02/2016 Document Reviewed: 04/17/2016 Elsevier Interactive Patient Education  2018 Elsevier Inc.  

## 2017-07-04 ENCOUNTER — Encounter: Payer: Self-pay | Admitting: Physical Medicine & Rehabilitation

## 2017-08-01 ENCOUNTER — Encounter: Payer: Medicare Other | Admitting: Physical Medicine & Rehabilitation

## 2017-08-08 ENCOUNTER — Ambulatory Visit: Payer: Self-pay | Admitting: Neurology

## 2017-08-10 ENCOUNTER — Encounter: Payer: Medicare Other | Admitting: Physical Medicine & Rehabilitation

## 2017-08-15 ENCOUNTER — Ambulatory Visit (INDEPENDENT_AMBULATORY_CARE_PROVIDER_SITE_OTHER): Payer: Medicare Other | Admitting: Adult Health

## 2017-08-15 ENCOUNTER — Encounter: Payer: Self-pay | Admitting: Adult Health

## 2017-08-15 VITALS — BP 146/88 | HR 92 | Wt 196.6 lb

## 2017-08-15 DIAGNOSIS — G4486 Cervicogenic headache: Secondary | ICD-10-CM

## 2017-08-15 DIAGNOSIS — M542 Cervicalgia: Secondary | ICD-10-CM | POA: Diagnosis not present

## 2017-08-15 DIAGNOSIS — M545 Low back pain: Secondary | ICD-10-CM

## 2017-08-15 DIAGNOSIS — G8929 Other chronic pain: Secondary | ICD-10-CM | POA: Diagnosis not present

## 2017-08-15 DIAGNOSIS — R51 Headache: Secondary | ICD-10-CM | POA: Diagnosis not present

## 2017-08-15 MED ORDER — CYCLOBENZAPRINE HCL 5 MG PO TABS: 5.0000 mg | ORAL_TABLET | Freq: Every day | ORAL | 3 refills | Status: DC

## 2017-08-15 NOTE — Patient Instructions (Signed)
Your Plan:  Stop baclofen Start Flexeril 5 mg at bedtime to help with headaches and neck pain If your symptoms worsen or you develop new symptoms please let us know.   Thank you for coming to see [place] at [facility]. I hope we have been able to provide you high quality care today.  You may receive a patient satisfaction survey over the next few weeks. We would appreciate your feedback and comments so that we may continue to improve ourselves and the health of our patients.

## 2017-08-15 NOTE — Progress Notes (Signed)
PATIENT: [name] DOB: [birth year]  REASON FOR VISIT: follow up HISTORY FROM: patient  HISTORY OF PRESENT ILLNESS: Today 08/15/17 [name] is a 51 year old male with a history of memory disturbance, headaches and chronic low back pain. He returns today for follow-up. At the last visit he was started on baclofen however he reports that did not offer him much benefit. He states that he continues to have almost a daily headache. He reports that his headaches normally ome extend from his neck pain. He states that he's been having ongoing neck pain that sometimes radiates down the right arm. He states he continues to have chronic low back pain as well. At the last visit he was advised to follow-up with his orthopedist the patient reports that he has not done this. He reports that he is not happy with his orthopedist and is looking to be referred to Dr. Ellene Route. The patient was also referred to psychiatry for depression and anxiety however he states that he did not follow up on this as he felt it was not necessary. He returns today for an evaluation.  HISTORY 05/01/2017. 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 He still has neck stiffness especially between the shoulder blades and lower back pain. He also reports itching. He does not have motor function loss as much as rigidity or a feeling of loss of range of motion. He was placed on Cymbalta by Cyndi Bender, his primary care provider now. He experienced since electric shock sensations especially the first 6 days of treatment. He is worried that these are caused by Cymbalta, which they could be. He has failed Lyrica, gabapentin and other medications in the past to help with back pain, and his pain has affected his ability to sleep. He would be considered a failed back surgery patient. We also performed a memory test  today and again the patient shows 29 out of 30 points, and we will not need to follow-up on these complains any longer. I think he is significantly depressed and frustrated. I would like for him to be seen by a psychologist, I would also refer him for designated pain therapy. He has still never seen Dr Ellene Route.  REVIEW OF SYSTEMS: Out of a complete 14 system review of symptoms, the patient complains only of the following symptoms, and all other reviewed systems are negative.  Ringing in ears, excessive sweating, blurred vision, shortness of breath, palpitations, insomnia, constipation, heat intolerance, frequency of urination, back pain, neck pain, neck stiffness, depression, nervous/anxious, dizziness, headache, numbness  ALLERGIES: No Known Allergies  HOME MEDICATIONS: Outpatient Medications Prior to Visit  Medication Sig Dispense Refill  . baclofen (LIORESAL) 10 MG tablet Take 1 tablet (10 mg total) by mouth at bedtime and may repeat dose one time if needed. 60 each 2  . DULoxetine (CYMBALTA) 60 MG capsule Take 60 mg by mouth daily.  6  . naproxen (NAPROSYN) 500 MG tablet Take 500 mg by mouth 2 (two) times daily with a meal.    . traMADol (ULTRAM) 50 MG tablet Take 50 mg by mouth every 6 (six) hours as needed.     No facility-administered medications prior to visit.     PAST MEDICAL HISTORY: Past Medical History:  Diagnosis Date  . Anxiety disorder   . GERD (gastroesophageal reflux disease)   . H/O testicular  cancer 1993   s/p R orchiectomy w/ LN dissection, Chemo  . HNP (herniated nucleus pulposus)   . Palpitations   . Subclinical hypothyroidism   . Testicular malignancy ([place])     PAST SURGICAL HISTORY: Past Surgical History:  Procedure Laterality Date  . BACK SURGERY  2010   Lumber fusion  . HAND SURGERY  1988  . NM MYOVIEW LTD  03/23/2015   NORMAL Lexiscan Myoview: No ST segment changes. No evidence of ischemia or infarction. Normal function.    FAMILY HISTORY: Family  History  Problem Relation Age of Onset  . Cancer Father 22  . Seizures Brother   . Cancer Brother 64  . Diabetes Brother   . Coronary artery disease Brother        PCI  . Heart attack Brother 32    SOCIAL HISTORY: Social History   Social History  . Marital status: Married    Spouse name: [name]  . Number of children: 1  . Years of education: N/A   Occupational History  . Not on file.   Social History Main Topics  . Smoking status: Former Smoker    Quit date: 03/07/2008  . Smokeless tobacco: Never Used  . Alcohol use 0.0 oz/week     Comment: very seldom  . Drug use: No  . Sexual activity: Not on file   Other Topics Concern  . Not on file   Social History Narrative   Patient lives at home with his wife Lynelle Smoke)   Patient is disabled.   Education GED.   Right handed.    One cup of coffee  Daily.       PHYSICAL EXAM  Vitals:   08/15/17 1350  BP: (!) 146/88  Pulse: 92  Weight: 196 lb 9.6 oz (89.2 kg)   Body mass index is 27.42 kg/m.  Generalized: Well developed, in no acute distress   Neurological examination  Mentation: Alert oriented to time, place, history taking. Follows all commands speech and language fluent Cranial nerve II-XII: Pupils were equal round reactive to light. Extraocular movements were full, visual field were full on confrontational test. Facial sensation and strength were normal. Uvula tongue midline. Head turning and shoulder shrug  were normal and symmetric. Motor: The motor testing reveals 5 over 5 strength of all 4 extremities. Good symmetric motor tone is noted throughout.  Sensory: Sensory testing is intact to soft touch on all 4 extremities. No evidence of extinction is noted.  Coordination: Cerebellar testing reveals good finger-nose-finger and heel-to-shin bilaterally.  Gait and station: Gait is normal.  Reflexes: Deep tendon reflexes are symmetric and normal bilaterally.   DIAGNOSTIC DATA (LABS, IMAGING, TESTING) - I reviewed  patient records, labs, notes, testing and imaging myself where available.  Lab Results  Component Value Date   WBC 12.7 (H) 03/06/2008   HGB 14.3 03/06/2008   HCT 42.0 03/06/2008   MCV 94.6 03/06/2008   PLT 474 (H) 03/06/2008      Component Value Date/Time   NA 141 05/20/2015 0954   K 4.6 05/20/2015 0954   CL 100 05/20/2015 0954   CO2 25 05/20/2015 0954   GLUCOSE 102 (H) 05/20/2015 0954   GLUCOSE 100 (H) 03/06/2008 1046   BUN 11 05/20/2015 0954   CREATININE 0.88 05/20/2015 0954   CALCIUM 9.7 05/20/2015 0954   PROT 7.1 05/20/2015 0954   ALBUMIN 4.7 05/20/2015 0954   AST 25 05/20/2015 0954   ALT 30 05/20/2015 0954   ALKPHOS 75 05/20/2015 0954  BILITOT 0.4 05/20/2015 0954   GFRNONAA 102 05/20/2015 0954   GFRAA 117 05/20/2015 0954     ASSESSMENT AND PLAN 51 y.o. year old male  has a past medical history of Anxiety disorder; GERD (gastroesophageal reflux disease); H/O testicular cancer (1993); HNP (herniated nucleus pulposus); Palpitations; Subclinical hypothyroidism; and Testicular malignancy ([place]). here with:  1. Headache possible cervicogenic headaches 2. Neck pain 3. Chronic back pain  The patient has essentially having daily headaches. Baclofen did not offer him much benefit. I advised that his headaches could be stemming from his neck pain. He should follow-up with his orthopedist for ongoing neck pain as he had an abnormal MRI several years ago. Patient plans to  asked Dr. Patrice Paradise for referral to Dr. Ellene Route. In the meantime he will stop baclofen. We will give Flexeril a try to see if it offers him any benefit. He will take Flexeril 5 mg at bedtime. Advised that if his symptoms worsen or he develops new symptoms he should let us know. He will follow-up in 4 months with Dr. Brett Fairy.     Ward Givens, MSN, NP-C 08/15/2017, 2:00 PM [facility] [address], [place], [place] [address] [phone]

## 2017-11-02 ENCOUNTER — Other Ambulatory Visit: Payer: Self-pay | Admitting: Neurology

## 2017-12-18 ENCOUNTER — Ambulatory Visit: Payer: Medicare Other | Admitting: Neurology

## 2020-08-13 ENCOUNTER — Other Ambulatory Visit (HOSPITAL_COMMUNITY): Payer: Self-pay | Admitting: Nurse Practitioner

## 2020-08-13 ENCOUNTER — Telehealth: Payer: Self-pay | Admitting: Registered Nurse

## 2020-08-13 ENCOUNTER — Encounter: Payer: Self-pay | Admitting: Registered Nurse

## 2020-08-13 DIAGNOSIS — U071 COVID-19: Secondary | ICD-10-CM

## 2020-08-13 DIAGNOSIS — E663 Overweight: Secondary | ICD-10-CM

## 2020-08-13 NOTE — Telephone Encounter (Signed)
Spouse of my patient who tested covid positive Tuesday 08/10/2020 and his symptoms started same body aches, congestion, chills, loss of taste/smell.  Patient notified of positive test results 08/12/2020 from [employer] [facility].  Patient with history borderline hypertension, mild cognitive impairment, overweight, testicular cancer history, intermittent palpitations.  [place]

## 2020-08-13 NOTE — Progress Notes (Signed)
I connected by phone with [name] on 08/13/2020 at 4:15 PM to discuss the potential use of a new treatment for mild to moderate COVID-19 viral infection in non-hospitalized patients.  This patient is a 54 y.o. male that meets the FDA criteria for Emergency Use Authorization of COVID monoclonal antibody casirivimab/imdevimab.  Has a (+) direct SARS-CoV-2 viral test result  Has mild or moderate COVID-19   Is NOT hospitalized due to COVID-19  Is within 10 days of symptom onset  Has at least one of the high risk factor(s) for progression to severe COVID-19 and/or hospitalization as defined in EUA.  Specific high risk criteria : BMI > 25   I have spoken and communicated the following to the patient or parent/caregiver regarding COVID monoclonal antibody treatment:  1. FDA has authorized the emergency use for the treatment of mild to moderate COVID-19 in adults and pediatric patients with positive results of direct SARS-CoV-2 viral testing who are 50 years of age and older weighing at least 40 kg, and who are at high risk for progressing to severe COVID-19 and/or hospitalization.  2. The significant known and potential risks and benefits of COVID monoclonal antibody, and the extent to which such potential risks and benefits are unknown.  3. Information on available alternative treatments and the risks and benefits of those alternatives, including clinical trials.  4. Patients treated with COVID monoclonal antibody should continue to self-isolate and use infection control measures (e.g., wear mask, isolate, social distance, avoid sharing personal items, clean and disinfect "high touch" surfaces, and frequent handwashing) according to CDC guidelines.   5. The patient or parent/caregiver has the option to accept or refuse COVID monoclonal antibody treatment.  After reviewing this information with the patient, The patient agreed to proceed with receiving casirivimab\imdevimab infusion and  will be provided a copy of the Fact sheet prior to receiving the infusion. Darol Destine Heide Scales 08/13/2020 4:15 PM

## 2020-08-14 ENCOUNTER — Other Ambulatory Visit (HOSPITAL_COMMUNITY): Payer: Self-pay

## 2020-08-14 ENCOUNTER — Ambulatory Visit (HOSPITAL_COMMUNITY)
Admission: RE | Admit: 2020-08-14 | Discharge: 2020-08-14 | Disposition: A | Discharge status: Home / Self Care | Payer: Medicare Other | Source: Ambulatory Visit | Attending: Pulmonary Disease | Admitting: Pulmonary Disease

## 2020-08-14 DIAGNOSIS — U071 COVID-19: Secondary | ICD-10-CM | POA: Insufficient documentation

## 2020-08-14 DIAGNOSIS — Z23 Encounter for immunization: Secondary | ICD-10-CM | POA: Insufficient documentation

## 2020-08-14 DIAGNOSIS — E663 Overweight: Secondary | ICD-10-CM | POA: Diagnosis present

## 2020-08-14 MED ORDER — SODIUM CHLORIDE 0.9 % IV SOLN
1200.0000 mg | Freq: Once | INTRAVENOUS | Status: AC
  Administered 2020-08-14: 1200 mg via INTRAVENOUS

## 2020-08-14 MED ORDER — EPINEPHRINE 0.3 MG/0.3ML IJ SOAJ: 0.3000 mg | Freq: Once | INTRAMUSCULAR | Status: DC | PRN

## 2020-08-14 MED ORDER — DIPHENHYDRAMINE HCL 50 MG/ML IJ SOLN: 50.0000 mg | Freq: Once | INTRAMUSCULAR | Status: DC | PRN

## 2020-08-14 MED ORDER — ALBUTEROL SULFATE HFA 108 (90 BASE) MCG/ACT IN AERS: 2.0000 | INHALATION_SPRAY | Freq: Once | RESPIRATORY_TRACT | Status: DC | PRN

## 2020-08-14 MED ORDER — METHYLPREDNISOLONE SODIUM SUCC 125 MG IJ SOLR: 125.0000 mg | Freq: Once | INTRAMUSCULAR | Status: DC | PRN

## 2020-08-14 MED ORDER — FAMOTIDINE IN NACL 20-0.9 MG/50ML-% IV SOLN: 20.0000 mg | Freq: Once | INTRAVENOUS | Status: DC | PRN

## 2020-08-14 MED ORDER — SODIUM CHLORIDE 0.9 % IV SOLN: INTRAVENOUS | Status: DC | PRN

## 2020-08-14 NOTE — Progress Notes (Signed)
  Diagnosis: COVID-19  Physician: Dr. Joya Gaskins  Procedure: Covid Infusion Clinic Med: casirivimab\imdevimab infusion - Provided patient with casirivimab\imdevimab fact sheet for patients, parents and caregivers prior to infusion.  Complications: No immediate complications noted.  Discharge: Discharged home   [name] 08/14/2020

## 2020-08-14 NOTE — Telephone Encounter (Signed)
Patient feeling slightly better today.  Monoclonal infusion scheduled.

## 2020-08-14 NOTE — Discharge Instructions (Signed)

## 2020-08-15 NOTE — Telephone Encounter (Signed)
Patient still feeling fatigued post monoclonal antibody infusion, sp02s stable at home.  Eating and hydrating without difficulty.

## 2021-03-23 DIAGNOSIS — E7800 Pure hypercholesterolemia, unspecified: Secondary | ICD-10-CM | POA: Diagnosis not present

## 2021-03-23 DIAGNOSIS — Z6826 Body mass index (BMI) 26.0-26.9, adult: Secondary | ICD-10-CM | POA: Diagnosis not present

## 2021-03-23 DIAGNOSIS — G4700 Insomnia, unspecified: Secondary | ICD-10-CM | POA: Diagnosis not present

## 2021-03-23 DIAGNOSIS — R7303 Prediabetes: Secondary | ICD-10-CM | POA: Diagnosis not present

## 2021-03-23 DIAGNOSIS — F418 Other specified anxiety disorders: Secondary | ICD-10-CM | POA: Diagnosis not present

## 2021-03-31 DIAGNOSIS — Z79891 Long term (current) use of opiate analgesic: Secondary | ICD-10-CM | POA: Diagnosis not present

## 2021-03-31 DIAGNOSIS — G894 Chronic pain syndrome: Secondary | ICD-10-CM | POA: Diagnosis not present

## 2021-03-31 DIAGNOSIS — M545 Low back pain, unspecified: Secondary | ICD-10-CM | POA: Diagnosis not present

## 2021-03-31 DIAGNOSIS — Z79899 Other long term (current) drug therapy: Secondary | ICD-10-CM | POA: Diagnosis not present

## 2021-03-31 DIAGNOSIS — M542 Cervicalgia: Secondary | ICD-10-CM | POA: Diagnosis not present

## 2021-04-29 DIAGNOSIS — E7800 Pure hypercholesterolemia, unspecified: Secondary | ICD-10-CM | POA: Diagnosis not present

## 2021-04-29 DIAGNOSIS — E875 Hyperkalemia: Secondary | ICD-10-CM | POA: Diagnosis not present

## 2021-09-20 DIAGNOSIS — E7800 Pure hypercholesterolemia, unspecified: Secondary | ICD-10-CM | POA: Diagnosis not present

## 2021-09-20 DIAGNOSIS — Z6827 Body mass index (BMI) 27.0-27.9, adult: Secondary | ICD-10-CM | POA: Diagnosis not present

## 2021-09-20 DIAGNOSIS — R21 Rash and other nonspecific skin eruption: Secondary | ICD-10-CM | POA: Diagnosis not present

## 2021-09-20 DIAGNOSIS — G4700 Insomnia, unspecified: Secondary | ICD-10-CM | POA: Diagnosis not present

## 2021-09-20 DIAGNOSIS — R7303 Prediabetes: Secondary | ICD-10-CM | POA: Diagnosis not present

## 2021-09-20 DIAGNOSIS — Z125 Encounter for screening for malignant neoplasm of prostate: Secondary | ICD-10-CM | POA: Diagnosis not present

## 2021-09-20 DIAGNOSIS — L739 Follicular disorder, unspecified: Secondary | ICD-10-CM | POA: Diagnosis not present

## 2021-09-20 DIAGNOSIS — B354 Tinea corporis: Secondary | ICD-10-CM | POA: Diagnosis not present

## 2021-09-20 DIAGNOSIS — F418 Other specified anxiety disorders: Secondary | ICD-10-CM | POA: Diagnosis not present

## 2021-09-29 DIAGNOSIS — Z79891 Long term (current) use of opiate analgesic: Secondary | ICD-10-CM | POA: Diagnosis not present

## 2021-09-29 DIAGNOSIS — M542 Cervicalgia: Secondary | ICD-10-CM | POA: Diagnosis not present

## 2021-09-29 DIAGNOSIS — M545 Low back pain, unspecified: Secondary | ICD-10-CM | POA: Diagnosis not present

## 2021-09-29 DIAGNOSIS — Z79899 Other long term (current) drug therapy: Secondary | ICD-10-CM | POA: Diagnosis not present

## 2021-09-29 DIAGNOSIS — G894 Chronic pain syndrome: Secondary | ICD-10-CM | POA: Diagnosis not present

## 2022-03-22 DIAGNOSIS — R7303 Prediabetes: Secondary | ICD-10-CM | POA: Diagnosis not present

## 2022-03-22 DIAGNOSIS — E7800 Pure hypercholesterolemia, unspecified: Secondary | ICD-10-CM | POA: Diagnosis not present

## 2022-03-22 DIAGNOSIS — G4700 Insomnia, unspecified: Secondary | ICD-10-CM | POA: Diagnosis not present

## 2022-03-22 DIAGNOSIS — Z1331 Encounter for screening for depression: Secondary | ICD-10-CM | POA: Diagnosis not present

## 2022-03-22 DIAGNOSIS — F418 Other specified anxiety disorders: Secondary | ICD-10-CM | POA: Diagnosis not present

## 2022-03-29 DIAGNOSIS — Z79899 Other long term (current) drug therapy: Secondary | ICD-10-CM | POA: Diagnosis not present

## 2022-03-29 DIAGNOSIS — M7918 Myalgia, other site: Secondary | ICD-10-CM | POA: Diagnosis not present

## 2022-03-29 DIAGNOSIS — Z79891 Long term (current) use of opiate analgesic: Secondary | ICD-10-CM | POA: Diagnosis not present

## 2022-03-29 DIAGNOSIS — G894 Chronic pain syndrome: Secondary | ICD-10-CM | POA: Diagnosis not present

## 2022-03-29 DIAGNOSIS — M545 Low back pain, unspecified: Secondary | ICD-10-CM | POA: Diagnosis not present

## 2022-03-29 DIAGNOSIS — M542 Cervicalgia: Secondary | ICD-10-CM | POA: Diagnosis not present

## 2022-03-29 DIAGNOSIS — Z6824 Body mass index (BMI) 24.0-24.9, adult: Secondary | ICD-10-CM | POA: Diagnosis not present

## 2022-07-23 ENCOUNTER — Ambulatory Visit
Admission: EM | Admit: 2022-07-23 | Discharge: 2022-07-23 | Disposition: A | Discharge status: Home / Self Care | Payer: Medicare HMO

## 2022-07-23 DIAGNOSIS — R030 Elevated blood-pressure reading, without diagnosis of hypertension: Secondary | ICD-10-CM | POA: Diagnosis not present

## 2022-07-23 DIAGNOSIS — J0100 Acute maxillary sinusitis, unspecified: Secondary | ICD-10-CM | POA: Diagnosis not present

## 2022-07-23 MED ORDER — AMOXICILLIN 875 MG PO TABS: 875.0000 mg | ORAL_TABLET | Freq: Two times a day (BID) | ORAL | 0 refills | Status: AC

## 2022-07-23 NOTE — ED Triage Notes (Signed)
Patient to Urgent Care with complaints of a productive cough, reports yellow sputum and nasal congestion. Symptoms started on 8/20. Denies any known sick contacts. At home negative covid test.

## 2022-07-23 NOTE — ED Provider Notes (Signed)
[facility]    CSN: [id number] Arrival date & time: 07/23/22  1009      History   Chief Complaint Chief Complaint  Patient presents with   Cough   Nasal Congestion    HPI [name] is a 56 y.o. male.  Patient presents with nasal congestion and productive cough x1 week.  He has yellow sputum.  He denies fever, chills, chest pain, shortness of breath, vomiting, diarrhea, or other symptoms.  Treatment at home with Mucinex; no OTC medications taken today.  His medical history includes borderline high blood pressure, former smoker, testicular cancer, cervical disc disorder.  The history is provided by the patient and medical records.    Past Medical History:  Diagnosis Date   Anxiety disorder    GERD (gastroesophageal reflux disease)    H/O testicular cancer 1993   s/p R orchiectomy w/ LN dissection, Chemo   HNP (herniated nucleus pulposus)    Palpitations    Subclinical hypothyroidism    Testicular malignancy [facility])     Patient Active Problem List   Diagnosis Date Noted   Persistent headaches 05/20/2015   MCI (mild cognitive impairment) with memory loss 05/20/2015   Cervical disc disorder with radiculopathy of cervical region 05/20/2015   Intermittent palpitations 05/15/2015   Chest pain with low risk for cardiac etiology 03/08/2015   Cannot walk 03/08/2015   Former smoker 03/08/2015   Lightheadedness 03/08/2015   Borderline high blood pressure 03/08/2015    Past Surgical History:  Procedure Laterality Date   BACK SURGERY  2010   Lumber fusion   HAND SURGERY  1988   NM MYOVIEW LTD  03/23/2015   NORMAL Lexiscan Myoview: No ST segment changes. No evidence of ischemia or infarction. Normal function.       Home Medications    Prior to Admission medications   Medication Sig Start Date End Date Taking? Authorizing Provider  amoxicillin (AMOXIL) 875 MG tablet Take 1 tablet (875 mg total) by mouth 2 (two) times daily for 10 days. 07/23/22 08/02/22 Yes  Sharion Balloon, NP  cyclobenzaprine (FLEXERIL) 5 MG tablet Take 1 tablet (5 mg total) by mouth at bedtime. 08/15/17   Ward Givens, NP  DULoxetine (CYMBALTA) 60 MG capsule Take 60 mg by mouth daily. 04/11/17   [provider]  naproxen (NAPROSYN) 500 MG tablet Take 500 mg by mouth 2 (two) times daily with a meal.    [provider]  predniSONE (STERAPRED UNI-PAK 21 TAB) 5 MG (21) TBPK tablet Take by mouth as directed. 07/18/22   [provider]  rosuvastatin (CRESTOR) 5 MG tablet Take 5 mg by mouth daily. 06/09/22   [provider]  traMADol (ULTRAM) 50 MG tablet Take 50 mg by mouth every 6 (six) hours as needed.    [provider]  zolpidem (AMBIEN) 10 MG tablet TAKE 1/2 TO 1 TABLET BY MOUTH AT BEDTIME FOR SLEEP 08/02/17   [provider]    Family History Family History  Problem Relation Age of Onset   Cancer Father 59   Seizures Brother    Cancer Brother 22   Diabetes Brother    Coronary artery disease Brother        PCI   Heart attack Brother 74    Social History Social History   Tobacco Use   Smoking status: Former    Types: Cigarettes    Quit date: 03/07/2008    Years since quitting: 14.3   Smokeless tobacco: Never  Substance Use Topics   Alcohol use: Yes    Alcohol/week: 0.0 standard drinks of alcohol    Comment: very seldom   Drug use: No     Allergies   Patient has no known allergies.   Review of Systems Review of Systems  Constitutional:  Negative for chills and fever.  HENT:  Positive for congestion. Negative for ear pain and sore throat.   Respiratory:  Positive for cough. Negative for shortness of breath.   Cardiovascular:  Negative for chest pain and palpitations.  Gastrointestinal:  Negative for diarrhea and vomiting.  Skin:  Negative for color change and rash.  All other systems reviewed and are negative.    Physical Exam Triage Vital Signs ED Triage Vitals  Enc Vitals Group     BP 07/23/22  1016 (!) 169/82     Pulse Rate 07/23/22 1016 68     Resp 07/23/22 1016 18     Temp 07/23/22 1016 98.1 F (36.7 C)     Temp src --      SpO2 07/23/22 1016 96 %     Weight 07/23/22 1017 195 lb (88.5 kg)     Height 07/23/22 1017 '5\' 11"'$  (1.803 m)     Head Circumference --      Peak Flow --      Pain Score 07/23/22 1017 0     Pain Loc --      Pain Edu? --      Excl. in [place]? --    No data found.  Updated Vital Signs BP (!) 155/87   Pulse 68   Temp 98.1 F (36.7 C)   Resp 18   Ht '5\' 11"'$  (1.803 m)   Wt 195 lb (88.5 kg)   SpO2 96%   BMI 27.20 kg/m   Visual Acuity Right Eye Distance:   Left Eye Distance:   Bilateral Distance:    Right Eye Near:   Left Eye Near:    Bilateral Near:     Physical Exam Vitals and nursing note reviewed.  Constitutional:      General: He is not in acute distress.    Appearance: Normal appearance. He is well-developed. He is not ill-appearing.  HENT:     Right Ear: Tympanic membrane normal.     Left Ear: Tympanic membrane normal.     Nose: Nose normal.     Mouth/Throat:     Mouth: Mucous membranes are moist.     Pharynx: Oropharynx is clear.  Cardiovascular:     Rate and Rhythm: Normal rate and regular rhythm.     Heart sounds: Normal heart sounds.  Pulmonary:     Effort: Pulmonary effort is normal. No respiratory distress.     Breath sounds: Normal breath sounds.  Musculoskeletal:     Cervical back: Neck supple.  Skin:    General: Skin is warm and dry.  Neurological:     Mental Status: He is alert.  Psychiatric:        Mood and Affect: Mood normal.        Behavior: Behavior normal.      UC Treatments / Results  Labs (all labs ordered are listed, but only abnormal results are displayed) Labs Reviewed - No data to display  EKG   Radiology No results found.  Procedures Procedures (including critical care time)  Medications Ordered in UC Medications - No data to display  Initial Impression / Assessment and Plan / UC  Course  I have reviewed the  triage vital signs and the nursing notes.  Pertinent labs & imaging results that were available during my care of the patient were reviewed by me and considered in my medical decision making (see chart for details).    Acute sinusitis, elevated blood pressure reading.  Patient has been symptomatic for 7 days.  He is not improving with OTC treatment.  Treating today with amoxicillin.  Tylenol or ibuprofen as needed.  Instructed him to follow-up with his PCP if his symptoms are not improving.  Also discussed that his blood pressure is elevated today and needs to be rechecked by his PCP in 2 to 4 weeks.  Education provided on preventing hypertension.  Patient agrees to plan of care.  Final Clinical Impressions(s) / UC Diagnoses   Final diagnoses:  Acute non-recurrent maxillary sinusitis  Elevated blood pressure reading     Discharge Instructions      Take the amoxicillin as directed.  Follow up with your primary care provider if your symptoms are not improving.    Your blood pressure is elevated today at 169/82; repeat 155/87.  Please have this rechecked by your primary care provider in 2-4 weeks.          ED Prescriptions     Medication Sig Dispense Auth. Provider   amoxicillin (AMOXIL) 875 MG tablet Take 1 tablet (875 mg total) by mouth 2 (two) times daily for 10 days. 20 tablet Sharion Balloon, NP      PDMP not reviewed this encounter.   Sharion Balloon, NP 07/23/22 1038

## 2022-07-23 NOTE — Discharge Instructions (Addendum)
Take the amoxicillin as directed.  Follow up with your primary care provider if your symptoms are not improving.    Your blood pressure is elevated today at 169/82; repeat 155/87.  Please have this rechecked by your primary care provider in 2-4 weeks.

## 2022-08-16 ENCOUNTER — Ambulatory Visit
Admission: EM | Admit: 2022-08-16 | Discharge: 2022-08-16 | Disposition: A | Discharge status: Home / Self Care | Payer: Medicare HMO | Attending: Urgent Care | Admitting: Urgent Care

## 2022-08-16 DIAGNOSIS — J329 Chronic sinusitis, unspecified: Secondary | ICD-10-CM | POA: Diagnosis not present

## 2022-08-16 MED ORDER — AMOXICILLIN-POT CLAVULANATE 875-125 MG PO TABS: 1.0000 | ORAL_TABLET | Freq: Two times a day (BID) | ORAL | 0 refills | Status: AC

## 2022-08-16 MED ORDER — METHYLPREDNISOLONE 4 MG PO TBPK: ORAL_TABLET | ORAL | 0 refills | Status: DC

## 2022-08-16 NOTE — Discharge Instructions (Addendum)
Follow up here or with your primary care provider if symptoms do not resolve with treatment.

## 2022-08-16 NOTE — ED Triage Notes (Signed)
Pt. States she was treated for Sinuitis on August 27th. After taking his prescribed medications he feels as if it is coming back

## 2022-08-16 NOTE — ED Provider Notes (Signed)
[name]    CSN: [id number] Arrival date & time: 08/16/22  1210      History   Chief Complaint Chief Complaint  Patient presents with   Facial Pain   Nasal Congestion    HPI [name] is a 56 y.o. male.   HPI  Patient presents to UC with complaint of recurrent rhinosinusitis.  He was treated on August 27 with Amoxil 875 mg twice daily x10 days.  Reports symptoms got better but not completely resolved and then once medication had stopped, the symptoms returned.  He reports pain below his eyes and in his forehead.  Pain is worse when he coughs.  Past Medical History:  Diagnosis Date   Anxiety disorder    GERD (gastroesophageal reflux disease)    H/O testicular cancer 1993   s/p R orchiectomy w/ LN dissection, Chemo   HNP (herniated nucleus pulposus)    Palpitations    Subclinical hypothyroidism    Testicular malignancy [facility])     Patient Active Problem List   Diagnosis Date Noted   Persistent headaches 05/20/2015   MCI (mild cognitive impairment) with memory loss 05/20/2015   Cervical disc disorder with radiculopathy of cervical region 05/20/2015   Intermittent palpitations 05/15/2015   Chest pain with low risk for cardiac etiology 03/08/2015   Cannot walk 03/08/2015   Former smoker 03/08/2015   Lightheadedness 03/08/2015   Borderline high blood pressure 03/08/2015    Past Surgical History:  Procedure Laterality Date   BACK SURGERY  2010   Lumber fusion   HAND SURGERY  1988   NM MYOVIEW LTD  03/23/2015   NORMAL Lexiscan Myoview: No ST segment changes. No evidence of ischemia or infarction. Normal function.       Home Medications    Prior to Admission medications   Medication Sig Start Date End Date Taking? Authorizing Provider  cyclobenzaprine (FLEXERIL) 5 MG tablet Take 1 tablet (5 mg total) by mouth at bedtime. 08/15/17   Ward Givens, NP  DULoxetine (CYMBALTA) 60 MG capsule Take 60 mg by mouth daily. 04/11/17   [provider]  naproxen (NAPROSYN) 500 MG tablet Take 500 mg by mouth 2 (two) times daily with a meal.    [provider]  predniSONE (STERAPRED UNI-PAK 21 TAB) 5 MG (21) TBPK tablet Take by mouth as directed. 07/18/22   [provider]  rosuvastatin (CRESTOR) 5 MG tablet Take 5 mg by mouth daily. 06/09/22   [provider]  traMADol (ULTRAM) 50 MG tablet Take 50 mg by mouth every 6 (six) hours as needed.    [provider]  zolpidem (AMBIEN) 10 MG tablet TAKE 1/2 TO 1 TABLET BY MOUTH AT BEDTIME FOR SLEEP 08/02/17   [provider]    Family History Family History  Problem Relation Age of Onset   Cancer Father 54   Seizures Brother    Cancer Brother 42   Diabetes Brother    Coronary artery disease Brother        PCI   Heart attack Brother 18    Social History Social History   Tobacco Use   Smoking status: Former    Types: Cigarettes    Quit date: 03/07/2008    Years since quitting: 14.4   Smokeless tobacco: Never  Substance Use Topics   Alcohol use: Yes    Alcohol/week: 0.0 standard drinks of alcohol    Comment: very seldom   Drug use: No     Allergies  Patient has no known allergies.   Review of Systems Review of Systems   Physical Exam Triage Vital Signs ED Triage Vitals  Enc Vitals Group     BP 08/16/22 1226 (!) 150/96     Pulse Rate 08/16/22 1226 99     Resp 08/16/22 1226 18     Temp 08/16/22 1226 99.4 F (37.4 C)     Temp Source 08/16/22 1226 Oral     SpO2 08/16/22 1226 95 %     Weight --      Height --      Head Circumference --      Peak Flow --      Pain Score 08/16/22 1237 0     Pain Loc --      Pain Edu? --      Excl. in [place]? --    No data found.  Updated Vital Signs BP (!) 150/96 (BP Location: Left Arm)   Pulse 99   Temp 99.4 F (37.4 C) (Oral)   Resp 18   SpO2 95%   Visual Acuity Right Eye Distance:   Left Eye Distance:   Bilateral Distance:    Right Eye Near:   Left Eye Near:     Bilateral Near:     Physical Exam Vitals reviewed.  Constitutional:      Appearance: Normal appearance. He is ill-appearing.  HENT:     Nose:     Right Sinus: Maxillary sinus tenderness and frontal sinus tenderness present.     Left Sinus: Maxillary sinus tenderness and frontal sinus tenderness present.     Mouth/Throat:     Pharynx: Posterior oropharyngeal erythema present. No oropharyngeal exudate.  Cardiovascular:     Rate and Rhythm: Normal rate and regular rhythm.     Pulses: Normal pulses.     Heart sounds: Normal heart sounds.  Pulmonary:     Effort: Pulmonary effort is normal.     Breath sounds: Normal breath sounds.  Skin:    General: Skin is warm and dry.  Neurological:     General: No focal deficit present.     Mental Status: He is alert and oriented to person, place, and time.  Psychiatric:        Mood and Affect: Mood normal.        Behavior: Behavior normal.      UC Treatments / Results  Labs (all labs ordered are listed, but only abnormal results are displayed) Labs Reviewed - No data to display  EKG   Radiology No results found.  Procedures Procedures (including critical care time)  Medications Ordered in UC Medications - No data to display  Initial Impression / Assessment and Plan / UC Course  I have reviewed the triage vital signs and the nursing notes.  Pertinent labs & imaging results that were available during my care of the patient were reviewed by me and considered in my medical decision making (see chart for details).   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   Final Clinical Impressions(s) / UC Diagnoses   Final diagnoses:  None   Discharge Instructions   None    ED Prescriptions   None    PDMP not reviewed this encounter.   Rose Phi, [place] 08/16/22 1254

## 2022-09-05 DIAGNOSIS — J019 Acute sinusitis, unspecified: Secondary | ICD-10-CM | POA: Diagnosis not present

## 2022-09-27 DIAGNOSIS — E7800 Pure hypercholesterolemia, unspecified: Secondary | ICD-10-CM | POA: Diagnosis not present

## 2022-09-27 DIAGNOSIS — J309 Allergic rhinitis, unspecified: Secondary | ICD-10-CM | POA: Diagnosis not present

## 2022-09-27 DIAGNOSIS — Z125 Encounter for screening for malignant neoplasm of prostate: Secondary | ICD-10-CM | POA: Diagnosis not present

## 2022-09-27 DIAGNOSIS — F418 Other specified anxiety disorders: Secondary | ICD-10-CM | POA: Diagnosis not present

## 2022-09-27 DIAGNOSIS — G4700 Insomnia, unspecified: Secondary | ICD-10-CM | POA: Diagnosis not present

## 2022-09-27 DIAGNOSIS — R7303 Prediabetes: Secondary | ICD-10-CM | POA: Diagnosis not present

## 2022-10-02 DIAGNOSIS — G894 Chronic pain syndrome: Secondary | ICD-10-CM | POA: Diagnosis not present

## 2022-10-02 DIAGNOSIS — M542 Cervicalgia: Secondary | ICD-10-CM | POA: Diagnosis not present

## 2022-10-02 DIAGNOSIS — M545 Low back pain, unspecified: Secondary | ICD-10-CM | POA: Diagnosis not present

## 2022-10-02 DIAGNOSIS — M791 Myalgia, unspecified site: Secondary | ICD-10-CM | POA: Diagnosis not present

## 2023-04-02 DIAGNOSIS — M542 Cervicalgia: Secondary | ICD-10-CM | POA: Diagnosis not present

## 2023-04-02 DIAGNOSIS — Z79891 Long term (current) use of opiate analgesic: Secondary | ICD-10-CM | POA: Diagnosis not present

## 2023-04-02 DIAGNOSIS — G894 Chronic pain syndrome: Secondary | ICD-10-CM | POA: Diagnosis not present

## 2023-04-02 DIAGNOSIS — Z79899 Other long term (current) drug therapy: Secondary | ICD-10-CM | POA: Diagnosis not present

## 2023-04-02 DIAGNOSIS — M545 Low back pain, unspecified: Secondary | ICD-10-CM | POA: Diagnosis not present

## 2023-04-03 DIAGNOSIS — R7303 Prediabetes: Secondary | ICD-10-CM | POA: Diagnosis not present

## 2023-04-03 DIAGNOSIS — F418 Other specified anxiety disorders: Secondary | ICD-10-CM | POA: Diagnosis not present

## 2023-04-03 DIAGNOSIS — J309 Allergic rhinitis, unspecified: Secondary | ICD-10-CM | POA: Diagnosis not present

## 2023-04-03 DIAGNOSIS — Z1331 Encounter for screening for depression: Secondary | ICD-10-CM | POA: Diagnosis not present

## 2023-04-03 DIAGNOSIS — E7800 Pure hypercholesterolemia, unspecified: Secondary | ICD-10-CM | POA: Diagnosis not present

## 2023-04-03 DIAGNOSIS — G4700 Insomnia, unspecified: Secondary | ICD-10-CM | POA: Diagnosis not present

## 2023-10-04 DIAGNOSIS — G894 Chronic pain syndrome: Secondary | ICD-10-CM | POA: Diagnosis not present

## 2023-10-04 DIAGNOSIS — M542 Cervicalgia: Secondary | ICD-10-CM | POA: Diagnosis not present

## 2023-10-04 DIAGNOSIS — M545 Low back pain, unspecified: Secondary | ICD-10-CM | POA: Diagnosis not present

## 2023-10-10 DIAGNOSIS — M79671 Pain in right foot: Secondary | ICD-10-CM | POA: Diagnosis not present

## 2023-10-10 DIAGNOSIS — G4700 Insomnia, unspecified: Secondary | ICD-10-CM | POA: Diagnosis not present

## 2023-10-10 DIAGNOSIS — R7303 Prediabetes: Secondary | ICD-10-CM | POA: Diagnosis not present

## 2023-10-10 DIAGNOSIS — E7800 Pure hypercholesterolemia, unspecified: Secondary | ICD-10-CM | POA: Diagnosis not present

## 2023-10-10 DIAGNOSIS — F418 Other specified anxiety disorders: Secondary | ICD-10-CM | POA: Diagnosis not present

## 2023-10-10 DIAGNOSIS — Z125 Encounter for screening for malignant neoplasm of prostate: Secondary | ICD-10-CM | POA: Diagnosis not present

## 2024-02-11 DIAGNOSIS — G894 Chronic pain syndrome: Secondary | ICD-10-CM | POA: Diagnosis not present

## 2024-02-11 DIAGNOSIS — M545 Low back pain, unspecified: Secondary | ICD-10-CM | POA: Diagnosis not present

## 2024-02-11 DIAGNOSIS — M542 Cervicalgia: Secondary | ICD-10-CM | POA: Diagnosis not present

## 2024-02-14 ENCOUNTER — Other Ambulatory Visit: Payer: Self-pay | Admitting: Orthopaedic Surgery

## 2024-02-14 DIAGNOSIS — M542 Cervicalgia: Secondary | ICD-10-CM

## 2024-02-14 DIAGNOSIS — M545 Low back pain, unspecified: Secondary | ICD-10-CM

## 2024-02-20 ENCOUNTER — Ambulatory Visit
Admission: RE | Admit: 2024-02-20 | Discharge: 2024-02-20 | Disposition: A | Discharge status: Home / Self Care | Source: Ambulatory Visit | Attending: Orthopaedic Surgery | Admitting: Orthopaedic Surgery

## 2024-02-20 DIAGNOSIS — M542 Cervicalgia: Secondary | ICD-10-CM

## 2024-02-20 DIAGNOSIS — M4802 Spinal stenosis, cervical region: Secondary | ICD-10-CM | POA: Diagnosis not present

## 2024-02-20 DIAGNOSIS — M545 Low back pain, unspecified: Secondary | ICD-10-CM

## 2024-02-21 ENCOUNTER — Other Ambulatory Visit

## 2024-02-25 ENCOUNTER — Encounter (HOSPITAL_BASED_OUTPATIENT_CLINIC_OR_DEPARTMENT_OTHER): Payer: Self-pay

## 2024-02-26 ENCOUNTER — Ambulatory Visit: Payer: Medicare HMO | Attending: Cardiology | Admitting: Cardiology

## 2024-02-26 ENCOUNTER — Encounter: Payer: Self-pay | Admitting: Cardiology

## 2024-02-26 VITALS — BP 138/82 | HR 74 | Ht 70.0 in | Wt 205.0 lb

## 2024-02-26 DIAGNOSIS — M47812 Spondylosis without myelopathy or radiculopathy, cervical region: Secondary | ICD-10-CM | POA: Insufficient documentation

## 2024-02-26 DIAGNOSIS — R072 Precordial pain: Secondary | ICD-10-CM | POA: Diagnosis not present

## 2024-02-26 DIAGNOSIS — R030 Elevated blood-pressure reading, without diagnosis of hypertension: Secondary | ICD-10-CM | POA: Diagnosis not present

## 2024-02-26 DIAGNOSIS — E782 Mixed hyperlipidemia: Secondary | ICD-10-CM

## 2024-02-26 DIAGNOSIS — R002 Palpitations: Secondary | ICD-10-CM

## 2024-02-26 DIAGNOSIS — Z8249 Family history of ischemic heart disease and other diseases of the circulatory system: Secondary | ICD-10-CM

## 2024-02-26 DIAGNOSIS — E785 Hyperlipidemia, unspecified: Secondary | ICD-10-CM | POA: Insufficient documentation

## 2024-02-26 LAB — BASIC METABOLIC PANEL WITH GFR
BUN/Creatinine Ratio: 12 (ref 9–20)
BUN: 12 mg/dL (ref 6–24)
CO2: 24 mmol/L (ref 20–29)
Calcium: 10 mg/dL (ref 8.7–10.2)
Chloride: 101 mmol/L (ref 96–106)
Creatinine, Ser: 1.04 mg/dL (ref 0.76–1.27)
Glucose: 109 mg/dL — ABNORMAL HIGH (ref 70–99)
Potassium: 4.9 mmol/L (ref 3.5–5.2)
Sodium: 141 mmol/L (ref 134–144)
eGFR: 84 mL/min/{1.73_m2} (ref >59)

## 2024-02-26 MED ORDER — METOPROLOL TARTRATE 100 MG PO TABS: 100.0000 mg | ORAL_TABLET | Freq: Once | ORAL | 0 refills | Status: DC

## 2024-02-26 NOTE — Assessment & Plan Note (Signed)
 Intermittent palpitations occurring once or twice a week, lasting a few minutes, often at night when lying down. Likely benign premature beats due to relaxation and adrenaline imbalance. No immediate need for a monitor unless symptoms worsen or become more frequent. Focus on addressing more dangerous issues first. - Monitor symptoms and consider beta blocker if palpitations become bothersome

## 2024-02-26 NOTE — Progress Notes (Signed)
 Cardiology Office Note:  .   Date:  02/26/2024  ID:  [name], DOB [birth year], MRN [id number] PCP: [name], [name]  [facility] Providers Cardiologist:  Ryan Lemma, MD     Chief Complaint  Patient presents with   New Patient (Initial Visit)    Evaluation of chest pain, palpitations and shortness of breath    Patient Profile: .     [name] is a  58 y.o. male former smoker with a PMH notable for hyperlipidemia who presents here for evaluation of chest tightness and shortness of breath on exertion at the request of Ryan Peak, PA-C.  (Self-referral)  Previously evaluated in 2016 for chest pain with a Myoview stress test that was nonischemic.      [name] presents today as a self-referral.  Subjective  Discussed the use of AI scribe software for clinical note transcription with the patient, who gave verbal consent to proceed.  History of Present Illness [name] is a 58 year old male former smoker with Fam Hx of Premature CAD (3 brothers) who presents with chest discomfort and palpitations.  He experiences a heaviness across his chest, which is not necessarily related to exertion and can occur at rest. The sensation is described as a 'funny feeling' similar to a stomach growling but located higher up in the chest. This discomfort is not associated with shortness of breath but is accompanied by a tightness in the chest. No worsening of symptoms with walking. Family history is significant for heart issues, with his mother having died of congestive heart failure and three brothers having had heart attacks or blockages requiring stents.  He experiences palpitations and lightheadedness, which occur every other day, lasting a few minutes, and are more frequent when lying down at night. The palpitations are described as 'skipping beats' and are annoying, though they occasionally cause slight lightheadedness. No significant dizziness or  syncope is reported. He denies shortness of breath, sudden onset weakness, slurred speech, or drooling.  He has a history of a herniated disc in his neck, for which he recently underwent an MRI. He experiences electrical zaps in his ears, which have been present since a back operation in 2012. He also reports neck spasms and zapping sensations that worsen with certain neck movements.  He has swelling in the top of his right foot, which has been ongoing for some time. The swelling increases with walking and decreases at night. His primary care physician has ruled out gout as a cause.  He is currently taking rosuvastatin for cholesterol, Cymbalta, Ambien (half dose), Ultram, and Naprosyn for pain. He occasionally uses a steroid pack for nerve pain. He denies current use of blood pressure medications and has a history of smoking cessation 15 years ago. He is generally active with his grandchildren but is currently limited due to back and neck issues.  Cardiovascular ROS: positive for - chest pain, irregular heartbeat, palpitations, and exercise intolerance; hands & feet stay cold negative for - edema, orthopnea, paroxysmal nocturnal dyspnea, shortness of breath, or syncope/near syncope; TIA/amaurosis fugax; claudicatino   ROS:  Review of Systems - Negative except HPI    Objective   Family History - Mother had congestive heart failure - died from complications (but he is not aware of details) - Brother had heart attack - PCI; -  & 2 other brothers have heart blockages - Stents placed  Medications - Rosuvastatin 5 mg daily - Flexeril-no longer taking - Steroid pack intermittently taking  for neuropathic pain - Cymbalta 60 mg daily - Ambien 10 mg-1/2 tab nightly as needed sleep. - Ultram 50 mg every 6 hours or - Naprosyn 500 mg twice daily as needed pain  Studies Reviewed: Marland Kitchen   EKG Interpretation Date/Time:  Tuesday February 26 2024 08:50:05 EDT Ventricular Rate:  74 PR Interval:  132 QRS  Duration:  102 QT Interval:  386 QTC Calculation: 428 R Axis:   25  Text Interpretation: Normal sinus rhythm Normal ECG When compared with ECG of 06-Mar-2008 09:50, Incomplete left bundle branch block is no longer Present Confirmed by [name] ([id number]) on 02/26/2024 8:57:15 AM    Results LABS Total cholesterol: 224 mg/dL (42/7062) Triglycerides: 223 mg/dL (37/6283) HDL: 35 mg/dL (15/1761) LDL: 607 mg/dL (37/1062) IRS8N: 4.6% (09/2023) Creatinine: 1.08 mg/dL (27/0350) Potassium: 4.1 mmol/L (09/2023)  DIAGNOSTIC Myoview stress test (03/24/2015) normal EKG.  No ischemia or infarction.  Normal EF.   Risk Assessment/Calculations:            Physical Exam:   VS:  BP 138/82 (BP Location: Left Arm, Patient Position: Sitting, Cuff Size: Normal)   Pulse 74   Ht 5\' 10"  (1.778 m)   Wt 205 lb (93 kg)   SpO2 98%   BMI 29.41 kg/m    Wt Readings from Last 3 Encounters:  02/26/24 205 lb (93 kg)  07/23/22 195 lb (88.5 kg)  08/15/17 196 lb 9.6 oz (89.2 kg)    GEN: Well nourished, well developed in no acute distress; mildly obese NECK: No JVD; No carotid bruits CARDIAC: Normal S1, S2; RRR, no murmurs, rubs, gallops RESPIRATORY:  Clear to auscultation without rales, wheezing or rhonchi ; nonlabored, good air movement. ABDOMEN: Soft, non-tender, non-distended EXTREMITIES:  No edema; No deformity     ASSESSMENT AND PLAN: .    Problem List Items Addressed This Visit       Cardiology Problems   Hyperlipidemia (Chronic)   Cholesterol levels are elevated with total cholesterol at 224 mg/dL, triglycerides at 093 mg/dL, HDL at 35 mg/dL, and LDL at 818 mg/dL. Currently on rosuvastatin. Discussed potential need to adjust the cholesterol medication dosage based on further findings from the coronary CT angiogram. - Consider increasing rosuvastatin dosage based on coronary CT angiogram results - At a minimum, needs to start lifestyle modifications.      Relevant Medications   metoprolol  tartrate (LOPRESSOR) 100 MG tablet     Other   Borderline high blood pressure (Chronic)   Borderline BP today.  Continue to monitor.  Pending results of Coronary CTA, may want to be more aggressive.  Based on CV risk factor modification.      DJD (degenerative joint disease) of cervical spine   Degenerative disc disease with a recent MRI for suspected herniated disc at C5-C6. Symptoms include neck pain and electrical zaps in the ears. Had a lower fusion in 2012 and symptoms are progressing upward. Awaiting MRI results to determine the need for injections or surgical intervention. - Await MRI results to determine the need for injections or surgical intervention      Family history of premature CAD (Chronic)   Please continue family history of CAD with poorly controlled lipids and borderline hypertension now presenting with chest discomfort.  Plan for ischemic evaluation with Coronary CTA.      Relevant Orders   Basic metabolic panel with GFR (Completed)   CT CORONARY MORPH W/CTA COR W/SCORE W/CA W/CM &/OR WO/CM   Intermittent palpitations (Chronic)   Intermittent palpitations occurring  once or twice a week, lasting a few minutes, often at night when lying down. Likely benign premature beats due to relaxation and adrenaline imbalance. No immediate need for a monitor unless symptoms worsen or become more frequent. Focus on addressing more dangerous issues first. - Monitor symptoms and consider beta blocker if palpitations become bothersome      Precordial pain - Primary (Chronic)   Intermittent chest discomfort described as heaviness across the chest, not necessarily associated with exertion.  Differential diagnosis includes coronary artery disease given family history of heart disease, elevated cholesterol, and former smoking history. Blood pressure is borderline.  Discussed the possibility of coronary artery disease and the need to rule out significant coronary blockages.  - Order  coronary CT angiogram to evaluate for coronary artery disease => provides detailed information about the presence and significance of coronary plaques, which can guide further management such as potential heart catheterization or adjustment of cholesterol medication. - Check chemistry panel to ensure kidney function is adequate for CT angiogram with contrast      Relevant Orders   EKG 12-Lead (Completed)   Basic metabolic panel with GFR (Completed)   CT CORONARY MORPH W/CTA COR W/SCORE W/CA W/CM &/OR WO/CM     Recording duration: 32 minutes Follow-Up: Return in about 2 months (around 04/27/2024) for Routine Follow-up after testing ~ 1-2 months, [employer].     Signed, Marykay Lex, MD, MS [name], M.D., M.S. Interventional Cardiologist  [facility]  Pager # [phone] Phone # [phone] [address] [place], [place] [address]

## 2024-02-26 NOTE — Assessment & Plan Note (Signed)
 Degenerative disc disease with a recent MRI for suspected herniated disc at C5-C6. Symptoms include neck pain and electrical zaps in the ears. Had a lower fusion in 2012 and symptoms are progressing upward. Awaiting MRI results to determine the need for injections or surgical intervention. - Await MRI results to determine the need for injections or surgical intervention

## 2024-02-26 NOTE — Assessment & Plan Note (Signed)
 Borderline BP today.  Continue to monitor.  Pending results of Coronary CTA, may want to be more aggressive.  Based on CV risk factor modification.

## 2024-02-26 NOTE — Assessment & Plan Note (Signed)
 Intermittent chest discomfort described as heaviness across the chest, not necessarily associated with exertion.  Differential diagnosis includes coronary artery disease given family history of heart disease, elevated cholesterol, and former smoking history. Blood pressure is borderline.  Discussed the possibility of coronary artery disease and the need to rule out significant coronary blockages.  - Order coronary CT angiogram to evaluate for coronary artery disease => provides detailed information about the presence and significance of coronary plaques, which can guide further management such as potential heart catheterization or adjustment of cholesterol medication. - Check chemistry panel to ensure kidney function is adequate for CT angiogram with contrast

## 2024-02-26 NOTE — Assessment & Plan Note (Signed)
 Please continue family history of CAD with poorly controlled lipids and borderline hypertension now presenting with chest discomfort.  Plan for ischemic evaluation with Coronary CTA.

## 2024-02-26 NOTE — Assessment & Plan Note (Signed)
 Cholesterol levels are elevated with total cholesterol at 224 mg/dL, triglycerides at 811 mg/dL, HDL at 35 mg/dL, and LDL at 914 mg/dL. Currently on rosuvastatin. Discussed potential need to adjust the cholesterol medication dosage based on further findings from the coronary CT angiogram. - Consider increasing rosuvastatin dosage based on coronary CT angiogram results - At a minimum, needs to start lifestyle modifications.

## 2024-02-26 NOTE — Patient Instructions (Signed)
 Medication Instructions:  See below  *If you need a refill on your cardiac medications before your next appointment, please call your pharmacy*   Lab Work: BMP If you have labs (blood work) drawn today and your tests are completely normal, you will receive your results only by: MyChart Message (if you have MyChart) OR A paper copy in the mail If you have any lab test that is abnormal or we need to change your treatment, we will call you to review the results.   Testing/Procedures: Will be schedule at [facility] Your physician has requested that you have coronary  CTA. Coronary computed tomography (CT)angiogram  is a special type of CT scan that uses a computer to produce multi-dimensional views of major blood vessels throughout the heart.  CT angiography, a contrast material is injected through an IV to help visualize the blood vessels  a painless test that uses an x-ray machine to take clear, detailed pictures of your heart arteries .  Please follow instruction sheet as given.    Follow-Up: At [facility], you and your health needs are our priority.  As part of our continuing mission to provide you with exceptional heart care, we have created designated Provider Care Teams.  These Care Teams include your primary Cardiologist (physician) and Advanced Practice Providers (APPs -  Physician Assistants and Nurse Practitioners) who all work together to provide you with the care you need, when you need it.     Your next appointment:   2 month(s)  The format for your next appointment:   In Person  Provider:   Bryan Lemma, MD    Other Instructions    Your cardiac CT will be scheduled at one of the below locations:   [facility] [address] [place], [place] [address] [phone]    Please arrive at the [facility] and Children's Entrance (Entrance C2) of [facility] 30 minutes prior to test start time. You can use the FREE valet parking offered at  entrance C (encouraged to control the heart rate for the test)  Proceed to the [facility] Radiology Department (first floor) to check-in and test prep.  All radiology patients and guests should use entrance C2 at [facility], accessed from [facility], even though the hospital's physical address listed is [address]      Please follow these instructions carefully (unless otherwise directed):  An IV will be required for this test and Nitroglycerin will be given.   Hold all erectile dysfunction medications at least 3 days (72 hrs) prior to test. (Ie viagra, cialis, sildenafil, tadalafil, etc)   On the Night Before the Test: Be sure to Drink plenty of water. Do not consume any caffeinated/decaffeinated beverages or chocolate 12 hours prior to your test. Do not take any antihistamines 12 hours prior to your test. .  On the Day of the Test: Drink plenty of water until 1 hour prior to the test. Do not eat any food 1 hour prior to test. You may take your regular medications prior to the test.  Take metoprolol (Lopressor) 100 mg two hours prior to test.       After the Test: Drink plenty of water. After receiving IV contrast, you may experience a mild flushed feeling. This is normal. On occasion, you may experience a mild rash up to 24 hours after the test. This is not dangerous. If this occurs, you can take Benadryl 25 mg, Zyrtec, Claritin, or Allegra  and increase your fluid intake. (Patients taking Tikosyn should avoid Benadryl, and may take Zyrtec, Claritin, or Allegra) If you experience trouble breathing, this can be serious. If it is severe call 911 IMMEDIATELY. If it is mild, please call our office.  We will call to schedule your test 2-4 weeks out understanding that some insurance companies will need an authorization prior to the service being performed.   For more information and frequently asked questions, please visit our website :  [email or web address]  For non-scheduling related questions, please contact the cardiac imaging nurse navigator should you have any questions/concerns: Cardiac Imaging Nurse Navigators Direct Office Dial: [phone]   For scheduling needs, including cancellations and rescheduling, please call [place], [phone].

## 2024-03-06 ENCOUNTER — Encounter (HOSPITAL_COMMUNITY): Payer: Self-pay

## 2024-03-06 DIAGNOSIS — M5412 Radiculopathy, cervical region: Secondary | ICD-10-CM | POA: Diagnosis not present

## 2024-03-07 ENCOUNTER — Telehealth (HOSPITAL_COMMUNITY): Payer: Self-pay | Admitting: *Deleted

## 2024-03-07 NOTE — Telephone Encounter (Signed)
 Reaching out to patient to offer assistance regarding upcoming cardiac imaging study; pt verbalizes understanding of appt date/time, parking situation and where to check in, pre-test NPO status and medications ordered, and verified current allergies; name and call back number provided for further questions should they arise  Larey Brick RN Navigator Cardiac Imaging [facility] [phone] office [phone] cell  Patient to take 100mg  metoprolol tartrate two hours prior to his cardiac CT scan. He is aware to arrive at 7:30 AM.

## 2024-03-10 ENCOUNTER — Encounter: Payer: Self-pay | Admitting: Cardiology

## 2024-03-10 ENCOUNTER — Ambulatory Visit (HOSPITAL_COMMUNITY)
Admission: RE | Admit: 2024-03-10 | Discharge: 2024-03-10 | Disposition: A | Discharge status: Home / Self Care | Source: Ambulatory Visit | Attending: Cardiology | Admitting: Cardiology

## 2024-03-10 DIAGNOSIS — R072 Precordial pain: Secondary | ICD-10-CM | POA: Diagnosis not present

## 2024-03-10 DIAGNOSIS — Z8249 Family history of ischemic heart disease and other diseases of the circulatory system: Secondary | ICD-10-CM

## 2024-03-10 MED ORDER — NITROGLYCERIN 0.4 MG SL SUBL
0.8000 mg | SUBLINGUAL_TABLET | Freq: Once | SUBLINGUAL | Status: AC
  Administered 2024-03-10: 0.8 mg via SUBLINGUAL

## 2024-03-10 MED ORDER — IOHEXOL 350 MG/ML SOLN
95.0000 mL | Freq: Once | INTRAVENOUS | Status: AC | PRN
  Administered 2024-03-10: 95 mL via INTRAVENOUS

## 2024-03-10 MED ORDER — NITROGLYCERIN 0.4 MG SL SUBL
SUBLINGUAL_TABLET | SUBLINGUAL | Status: AC
  Filled 2024-03-10: qty 2

## 2024-04-07 DIAGNOSIS — M5412 Radiculopathy, cervical region: Secondary | ICD-10-CM | POA: Diagnosis not present

## 2024-04-09 DIAGNOSIS — I2510 Atherosclerotic heart disease of native coronary artery without angina pectoris: Secondary | ICD-10-CM | POA: Diagnosis not present

## 2024-04-09 DIAGNOSIS — R7303 Prediabetes: Secondary | ICD-10-CM | POA: Diagnosis not present

## 2024-04-09 DIAGNOSIS — F418 Other specified anxiety disorders: Secondary | ICD-10-CM | POA: Diagnosis not present

## 2024-04-09 DIAGNOSIS — Z1331 Encounter for screening for depression: Secondary | ICD-10-CM | POA: Diagnosis not present

## 2024-04-09 DIAGNOSIS — E7800 Pure hypercholesterolemia, unspecified: Secondary | ICD-10-CM | POA: Diagnosis not present

## 2024-04-09 DIAGNOSIS — G4700 Insomnia, unspecified: Secondary | ICD-10-CM | POA: Diagnosis not present

## 2024-05-20 ENCOUNTER — Encounter: Payer: Self-pay | Admitting: Cardiology

## 2024-05-20 ENCOUNTER — Ambulatory Visit: Attending: Cardiology | Admitting: Cardiology

## 2024-05-20 VITALS — BP 126/84 | HR 69 | Ht 70.5 in | Wt 201.6 lb

## 2024-05-20 DIAGNOSIS — R030 Elevated blood-pressure reading, without diagnosis of hypertension: Secondary | ICD-10-CM | POA: Diagnosis not present

## 2024-05-20 DIAGNOSIS — E785 Hyperlipidemia, unspecified: Secondary | ICD-10-CM | POA: Diagnosis not present

## 2024-05-20 DIAGNOSIS — R072 Precordial pain: Secondary | ICD-10-CM | POA: Diagnosis not present

## 2024-05-20 DIAGNOSIS — R002 Palpitations: Secondary | ICD-10-CM

## 2024-05-20 NOTE — Patient Instructions (Addendum)
 Medication Instructions:   Not needed *   Lab Work:  Not needed   Testing/Procedures:  Not needed  Follow-Up: At [facility], you and your health needs are our priority.  As part of our continuing mission to provide you with exceptional heart care, we have created designated Provider Care Teams.  These Care Teams include your primary Cardiologist (physician) and Advanced Practice Providers (APPs -  Physician Assistants and Nurse Practitioners) who all work together to provide you with the care you need, when you need it.     Your next appointment:   As needed   The format for your next appointment:   In Person  Provider:   Alm Clay, MD

## 2024-05-20 NOTE — Progress Notes (Signed)
 Cardiology Office Note:  .   Date:  05/28/2024  ID:  [name], DOB [birth year], MRN [id number] PCP: Montey Lot, DEVONNA  [facility] Providers Cardiologist:  Alm Clay, MD     Chief Complaint  Patient presents with   Follow-up    Follow-up to discuss test results.  Overall feeling better.    Patient Profile: .     Edgel L Toruno is a  58 y.o. male with a PMH notable for hyperlipidemia and small former smoker who presents here for 54-month follow-up to discuss test results at the request of Montey Lot, [place].  Valon L Arey presents for follow-up evaluation of chest tightness and shortness of breath on exertion at the request of Montey Lot, PA-C.  (Self-referral)  Previously evaluated in 2016 for chest pain with a Myoview  stress test that was nonischemic.        Yukio L Massey was seen on February 26, 2024 for evaluation of chest discomfort and palpitations.  He has a pretty significant family history with 3 brothers having premature CAD.  He described a heaviness across his chest that may or may not midodrine associate with exertion.  Not necessarily worsened with exertion.  Also noted palpitations and lightheadedness lasting few minutes on occasion almost every day or every other day.  Described as skipping beats.  Will call still with a herniated disc. => Evaluated Coronary CTA  Subjective  Discussed the use of AI scribe software for clinical note transcription with the patient, who gave verbal consent to proceed.  History of Present Illness History of Present Illness Elmond L Vivar is a 58 year old male with hyperlipidemia who presents with chest tightness and palpitations.  He experiences chest tightness that occurs less frequently than before. The sensation is still present but not as often.  He experiences palpitations once a week, lasting about five to ten minutes. These episodes occasionally cause dizziness or lightheadedness, especially  when lying down at the end of the day. Previously, he experienced palpitations two to three times a week. He associates these episodes with stress and anxiety.  He is currently taking a low dose of rosuvastatin and reports doing okay with it. A higher dose previously caused joint aches. His LDL cholesterol has decreased from 128 to 110 since starting the medication.  He has a family history of heart disease, with his brother having had a heart attack and stents, and his mother having heart disease. He has a history of smoking, which, along with his family history and cholesterol levels, are considered risk factors for heart disease.  No pass out spells and he reports doing okay with shortness of breath while walking.     Objective   Family History - Mother had congestive heart failure - died from complications (but he is not aware of details) - Brother had heart attack - PCI; -  & 2 other brothers have heart blockages - Stents placed  Studies Reviewed: [facility]       Coronary CTA 03/10/2024: CAC score 4.  Nonobstructive CAD.  Mild 25 to 49% calcified plaque in the mid RCA.  Labs (04/09/2024): TC 181, TG 150, HDL 44, LDL 110.  A1c 4.8. Cr 1.02, K+ 4.5.   Risk Assessment/Calculations:         Physical Exam:   VS:  BP 126/84   Pulse 69   Ht 5' 10.5 (1.791 m)   Wt 201 lb 9.6 oz (91.4 kg)   SpO2 97%   BMI  28.52 kg/m    Wt Readings from Last 3 Encounters:  05/20/24 201 lb 9.6 oz (91.4 kg)  02/26/24 205 lb (93 kg)  07/23/22 195 lb (88.5 kg)    GEN: Well nourished, well groomed in no acute distress; healthy-appearing NECK: No JVD; No carotid bruits CARDIAC: Normal S1, S2; RRR, no murmurs, rubs, gallops RESPIRATORY:  Clear to auscultation without rales, wheezing or rhonchi ; nonlabored, good air movement. ABDOMEN: Soft, non-tender, non-distended EXTREMITIES:  No edema; No deformity      ASSESSMENT AND PLAN: .    Problem List Items Addressed This Visit       Cardiology Problems    Hyperlipidemia with target low density lipoprotein (LDL) cholesterol less than 100 mg/dL (Chronic)    LDL decreased from 128 to 889 mg/dL on 5 mg rosuvastatin. Family history of heart disease. Previous higher dose caused arthralgia. Coronary CT shows mild plaque, asymptomatic. Goal LDL <70 mg/dL, <899 mg/dL acceptable. Coronary CT reassuring with calcium score of 4, low risk for 58 year old. Non-statin adjuncts may be considered to avoid side effects of higher statin doses. - Continue 5 mg rosuvastatin. - Discuss with primary care about adding non-statin adjunct to lower LDL. - Maintain blood pressure control and dietary modifications. - Follow up with primary care for cholesterol management.        Other   Borderline high blood pressure (Chronic)   Stable blood pressure today.  Continue to monitor.      Intermittent palpitations (Chronic)   Palpitations occur weekly, lasting 5-10 minutes, often when supine. Symptoms not indicative of dangerous arrhythmia, likely stress and anxiety-related. Frequency decreased from 2-3 times a week to once a week. - Monitor frequency and severity. - Consider ambulatory monitoring if symptoms increase or become bothersome. - Contact office if palpitations worsen or if exertional chest pain occurs.      Precordial pain - Primary (Chronic)   Very reassuring results of Coronary CTA suggesting mild mid RCA disease but otherwise nonobstructive CAD.  Unlikely that her symptoms are cardiac in nature.  Presence of coronary calcification does warrant increased awareness and treatment of lipids and other risk factors.              Follow-Up: Return if symptoms worsen or fail to improve, for Followup when necessary.     Signed, Alm MICAEL Clay, MD, MS Alm Clay, M.D., M.S. Interventional Chartered certified accountant  Pager # [phone]

## 2024-05-28 ENCOUNTER — Encounter: Payer: Self-pay | Admitting: Cardiology

## 2024-05-28 NOTE — Assessment & Plan Note (Signed)
Stable blood pressure today. Continue to monitor.

## 2024-05-28 NOTE — Assessment & Plan Note (Signed)
  LDL decreased from 128 to 889 mg/dL on 5 mg rosuvastatin. Family history of heart disease. Previous higher dose caused arthralgia. Coronary CT shows mild plaque, asymptomatic. Goal LDL <70 mg/dL, <899 mg/dL acceptable. Coronary CT reassuring with calcium score of 4, low risk for 58 year old. Non-statin adjuncts may be considered to avoid side effects of higher statin doses. - Continue 5 mg rosuvastatin. - Discuss with primary care about adding non-statin adjunct to lower LDL. - Maintain blood pressure control and dietary modifications. - Follow up with primary care for cholesterol management.

## 2024-05-28 NOTE — Assessment & Plan Note (Signed)
 Palpitations occur weekly, lasting 5-10 minutes, often when supine. Symptoms not indicative of dangerous arrhythmia, likely stress and anxiety-related. Frequency decreased from 2-3 times a week to once a week. - Monitor frequency and severity. - Consider ambulatory monitoring if symptoms increase or become bothersome. - Contact office if palpitations worsen or if exertional chest pain occurs.

## 2024-05-28 NOTE — Assessment & Plan Note (Signed)
 Very reassuring results of Coronary CTA suggesting mild mid RCA disease but otherwise nonobstructive CAD.  Unlikely that her symptoms are cardiac in nature.  Presence of coronary calcification does warrant increased awareness and treatment of lipids and other risk factors.

## 2024-09-25 DIAGNOSIS — M545 Low back pain, unspecified: Secondary | ICD-10-CM | POA: Diagnosis not present

## 2024-09-25 DIAGNOSIS — Z1330 Encounter for screening examination for mental health and behavioral disorders, unspecified: Secondary | ICD-10-CM | POA: Diagnosis not present

## 2024-09-25 DIAGNOSIS — M47812 Spondylosis without myelopathy or radiculopathy, cervical region: Secondary | ICD-10-CM | POA: Diagnosis not present

## 2024-09-25 DIAGNOSIS — M542 Cervicalgia: Secondary | ICD-10-CM | POA: Diagnosis not present

## 2024-09-25 DIAGNOSIS — G894 Chronic pain syndrome: Secondary | ICD-10-CM | POA: Diagnosis not present

## 2024-10-16 DIAGNOSIS — Z125 Encounter for screening for malignant neoplasm of prostate: Secondary | ICD-10-CM | POA: Diagnosis not present

## 2024-10-16 DIAGNOSIS — J309 Allergic rhinitis, unspecified: Secondary | ICD-10-CM | POA: Diagnosis not present

## 2024-10-16 DIAGNOSIS — I2510 Atherosclerotic heart disease of native coronary artery without angina pectoris: Secondary | ICD-10-CM | POA: Diagnosis not present

## 2024-10-16 DIAGNOSIS — E7800 Pure hypercholesterolemia, unspecified: Secondary | ICD-10-CM | POA: Diagnosis not present

## 2024-10-16 DIAGNOSIS — R7303 Prediabetes: Secondary | ICD-10-CM | POA: Diagnosis not present

## 2024-10-16 DIAGNOSIS — F418 Other specified anxiety disorders: Secondary | ICD-10-CM | POA: Diagnosis not present

## 2024-10-16 DIAGNOSIS — G4700 Insomnia, unspecified: Secondary | ICD-10-CM | POA: Diagnosis not present
